# Patient Record
Sex: Male | Born: 1955 | Race: White | Hispanic: No | Marital: Married | Smoking: Never smoker
Health system: Southern US, Community
[De-identification: ages and names within clinical notes are randomized; demographics above are authoritative.]

## PROBLEM LIST (undated history)

## (undated) DIAGNOSIS — K5792 Diverticulitis of intestine, part unspecified, without perforation or abscess without bleeding: Secondary | ICD-10-CM

## (undated) DIAGNOSIS — Z8701 Personal history of pneumonia (recurrent): Secondary | ICD-10-CM

## (undated) DIAGNOSIS — E119 Type 2 diabetes mellitus without complications: Secondary | ICD-10-CM

## (undated) DIAGNOSIS — M1990 Unspecified osteoarthritis, unspecified site: Secondary | ICD-10-CM

## (undated) DIAGNOSIS — Z87442 Personal history of urinary calculi: Secondary | ICD-10-CM

## (undated) DIAGNOSIS — K5790 Diverticulosis of intestine, part unspecified, without perforation or abscess without bleeding: Secondary | ICD-10-CM

## (undated) DIAGNOSIS — J45909 Unspecified asthma, uncomplicated: Secondary | ICD-10-CM

## (undated) DIAGNOSIS — E785 Hyperlipidemia, unspecified: Secondary | ICD-10-CM

## (undated) DIAGNOSIS — J189 Pneumonia, unspecified organism: Secondary | ICD-10-CM

## (undated) DIAGNOSIS — D72819 Decreased white blood cell count, unspecified: Secondary | ICD-10-CM

## (undated) DIAGNOSIS — J40 Bronchitis, not specified as acute or chronic: Secondary | ICD-10-CM

## (undated) DIAGNOSIS — U071 COVID-19: Secondary | ICD-10-CM

## (undated) DIAGNOSIS — K760 Fatty (change of) liver, not elsewhere classified: Secondary | ICD-10-CM

## (undated) DIAGNOSIS — F411 Generalized anxiety disorder: Secondary | ICD-10-CM

## (undated) DIAGNOSIS — I10 Essential (primary) hypertension: Secondary | ICD-10-CM

## (undated) DIAGNOSIS — G2581 Restless legs syndrome: Secondary | ICD-10-CM

## (undated) DIAGNOSIS — G4733 Obstructive sleep apnea (adult) (pediatric): Secondary | ICD-10-CM

## (undated) DIAGNOSIS — G4730 Sleep apnea, unspecified: Secondary | ICD-10-CM

## (undated) DIAGNOSIS — D696 Thrombocytopenia, unspecified: Secondary | ICD-10-CM

## (undated) DIAGNOSIS — M170 Bilateral primary osteoarthritis of knee: Secondary | ICD-10-CM

## (undated) DIAGNOSIS — Z87828 Personal history of other (healed) physical injury and trauma: Secondary | ICD-10-CM

## (undated) DIAGNOSIS — T7840XA Allergy, unspecified, initial encounter: Secondary | ICD-10-CM

## (undated) DIAGNOSIS — J449 Chronic obstructive pulmonary disease, unspecified: Secondary | ICD-10-CM

## (undated) HISTORY — DX: Personal history of other (healed) physical injury and trauma: Z87.828

## (undated) HISTORY — PX: EXCISIONAL HEMORRHOIDECTOMY: SHX1541

## (undated) HISTORY — PX: TONSILLECTOMY: SUR1361

## (undated) HISTORY — DX: Unspecified osteoarthritis, unspecified site: M19.90

## (undated) HISTORY — DX: Decreased white blood cell count, unspecified: D72.819

## (undated) HISTORY — DX: Generalized anxiety disorder: F41.1

## (undated) HISTORY — PX: MENISCUS REPAIR: SHX5179

## (undated) HISTORY — DX: Hyperlipidemia, unspecified: E78.5

## (undated) HISTORY — DX: Personal history of pneumonia (recurrent): Z87.01

## (undated) HISTORY — DX: Essential (primary) hypertension: I10

## (undated) HISTORY — DX: Sleep apnea, unspecified: G47.30

## (undated) HISTORY — DX: Unspecified asthma, uncomplicated: J45.909

## (undated) HISTORY — PX: ELBOW ARTHROSCOPY WITH TENDON RECONSTRUCTION: SHX5616

## (undated) HISTORY — DX: Allergy, unspecified, initial encounter: T78.40XA

## (undated) HISTORY — DX: Thrombocytopenia, unspecified: D69.6

## (undated) HISTORY — DX: Restless legs syndrome: G25.81

## (undated) HISTORY — PX: HERNIA REPAIR: SHX51

---

## 1987-12-26 HISTORY — PX: VASECTOMY: SHX75

## 2004-12-25 HISTORY — PX: UMBILICAL HERNIA REPAIR: SHX196

## 2005-02-13 ENCOUNTER — Ambulatory Visit: Payer: Self-pay | Admitting: Surgery

## 2006-04-12 ENCOUNTER — Ambulatory Visit: Payer: Self-pay | Admitting: Gastroenterology

## 2008-10-26 ENCOUNTER — Ambulatory Visit: Payer: Self-pay

## 2008-10-30 ENCOUNTER — Ambulatory Visit: Payer: Self-pay

## 2008-11-11 ENCOUNTER — Ambulatory Visit: Payer: Self-pay

## 2009-03-11 ENCOUNTER — Ambulatory Visit: Payer: Self-pay

## 2009-07-14 ENCOUNTER — Ambulatory Visit: Payer: Self-pay | Admitting: Orthopedic Surgery

## 2009-07-16 ENCOUNTER — Ambulatory Visit: Payer: Self-pay | Admitting: Orthopedic Surgery

## 2009-07-20 ENCOUNTER — Ambulatory Visit: Payer: Self-pay | Admitting: Orthopedic Surgery

## 2010-05-05 ENCOUNTER — Ambulatory Visit: Payer: Self-pay | Admitting: Orthopedic Surgery

## 2010-06-03 ENCOUNTER — Ambulatory Visit: Payer: Self-pay | Admitting: Orthopedic Surgery

## 2010-07-13 ENCOUNTER — Ambulatory Visit: Payer: Self-pay | Admitting: Orthopedic Surgery

## 2010-07-21 ENCOUNTER — Ambulatory Visit: Payer: Self-pay | Admitting: Orthopedic Surgery

## 2010-09-30 ENCOUNTER — Ambulatory Visit: Payer: Self-pay | Admitting: Orthopedic Surgery

## 2010-09-30 IMAGING — US US EXTREM LOW VENOUS*L*
1 series · 17 of 21 positions shown · non-contrast
Comparison: none

REASON FOR EXAM: left knee pain and swelling
COMMENTS:

[Series 1: us extrem low venous*left* · 17 of 21 slices shown]
[im 1/21]
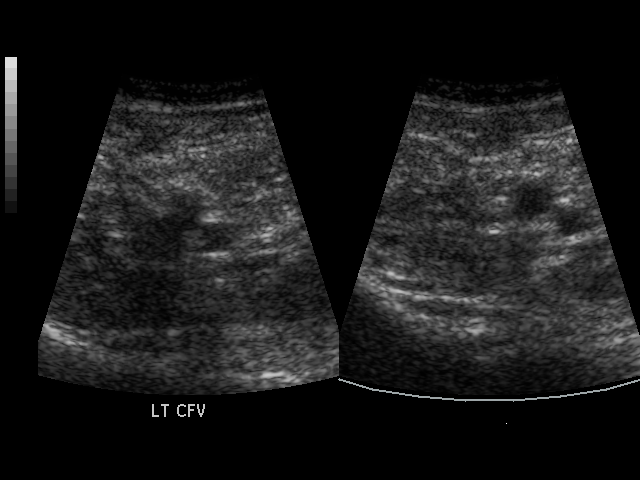
[im 2/21]
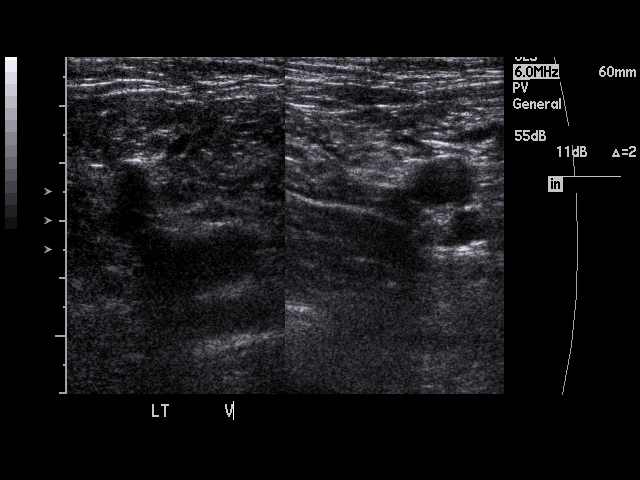
[im 4/21]
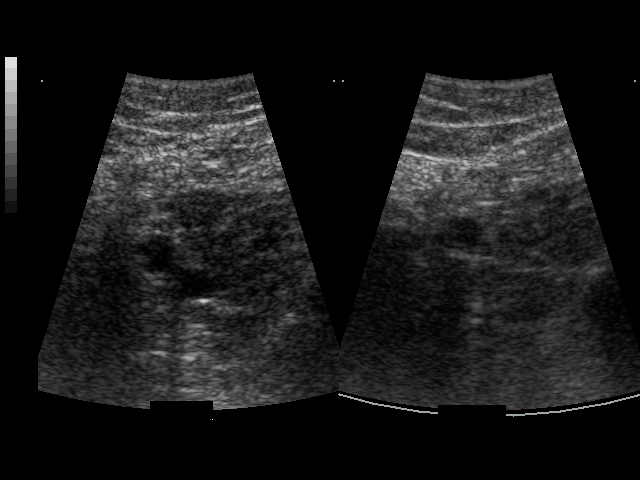
[im 5/21]
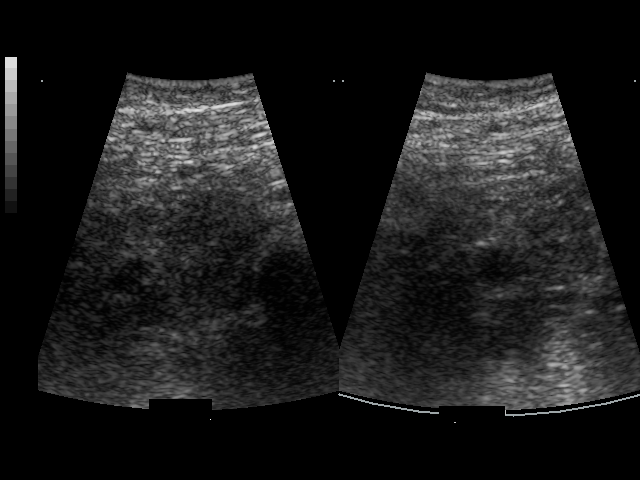
[im 6/21]
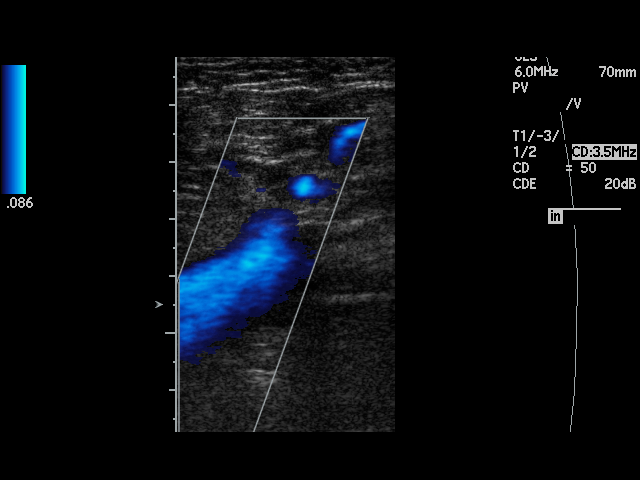
[im 7/21]
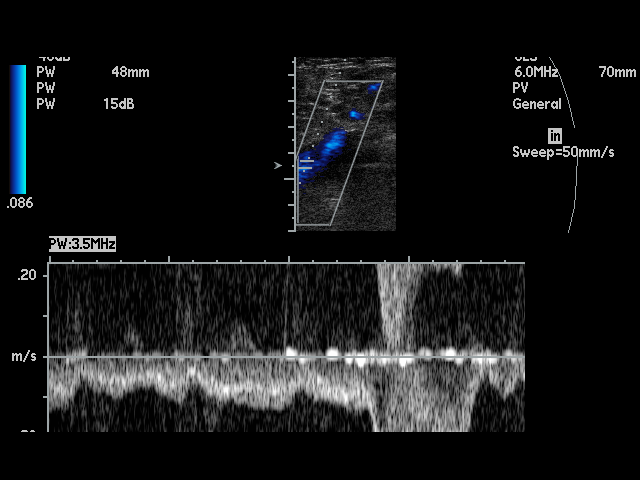
[im 9/21]
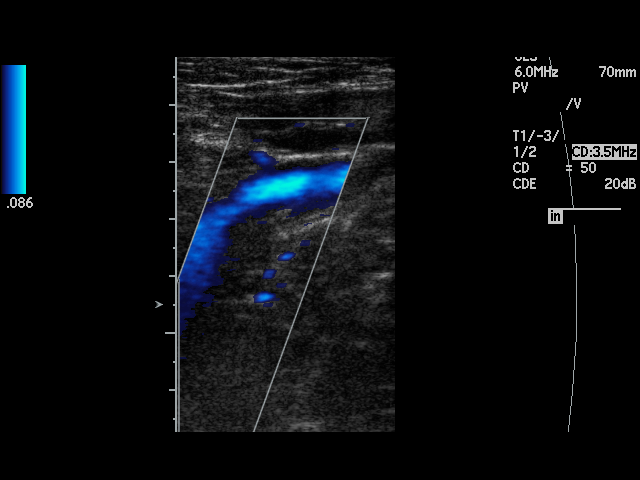
[im 10/21]
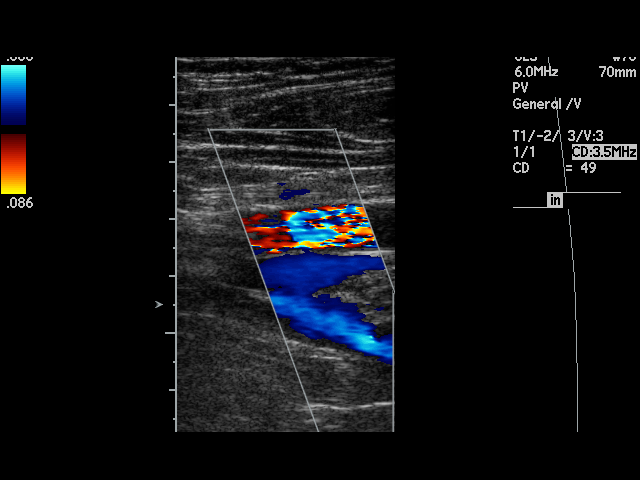
[im 11/21]
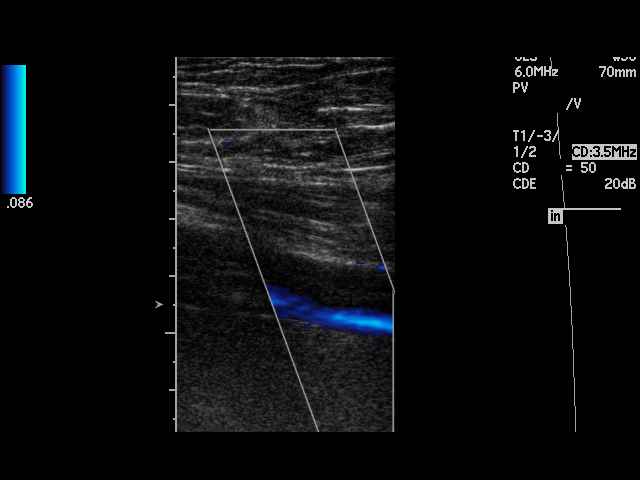
[im 12/21]
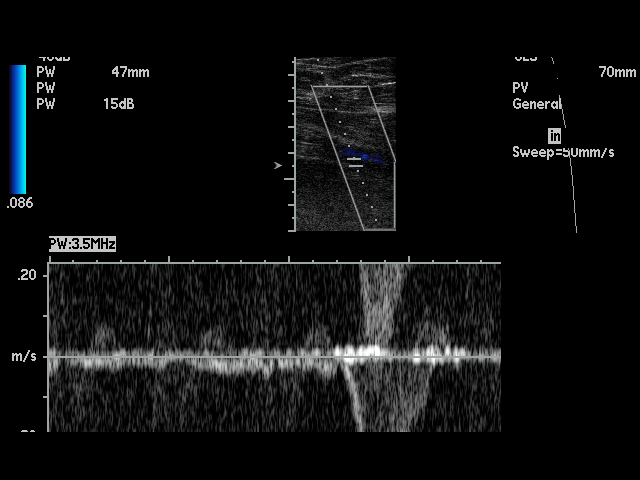
[im 13/21]
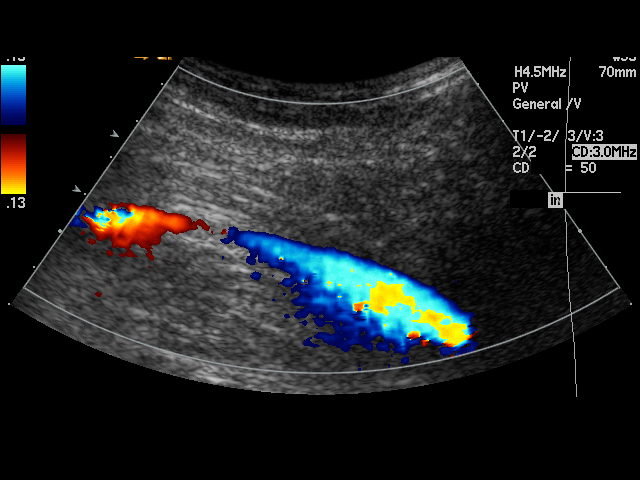
[im 15/21]
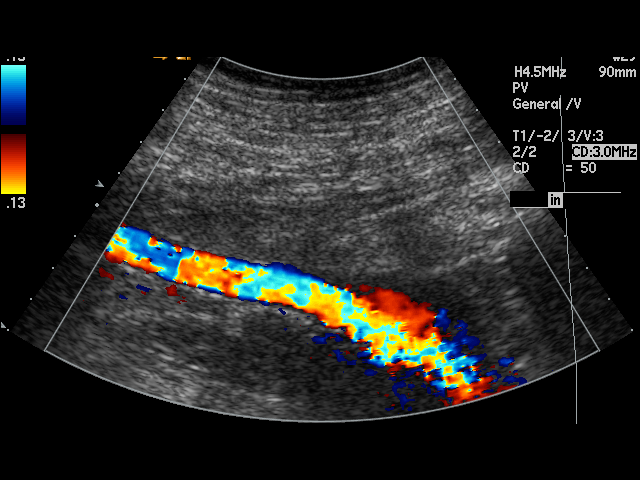
[im 16/21]
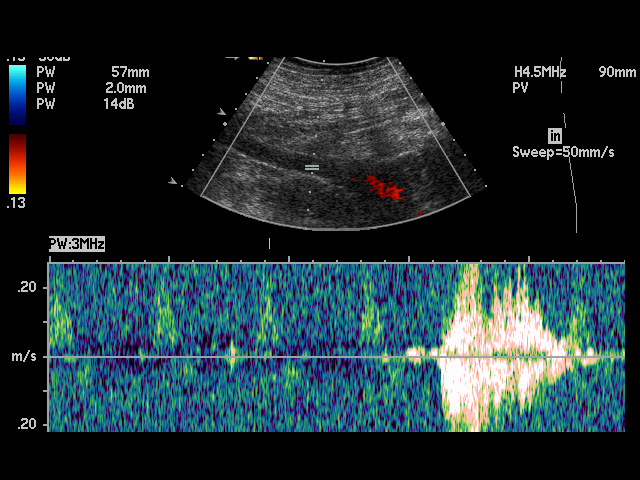
[im 17/21]
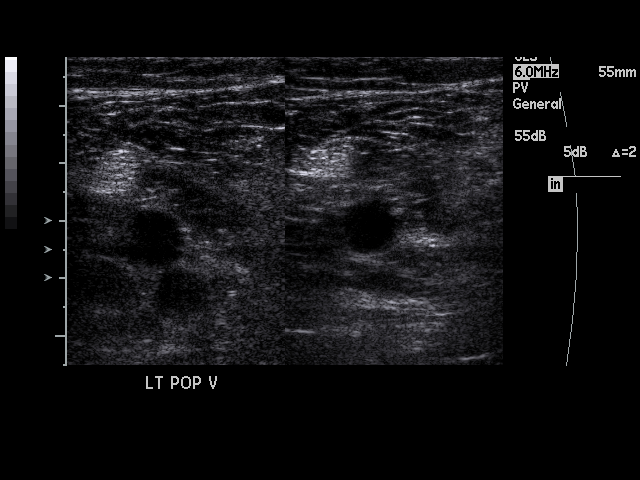
[im 18/21]
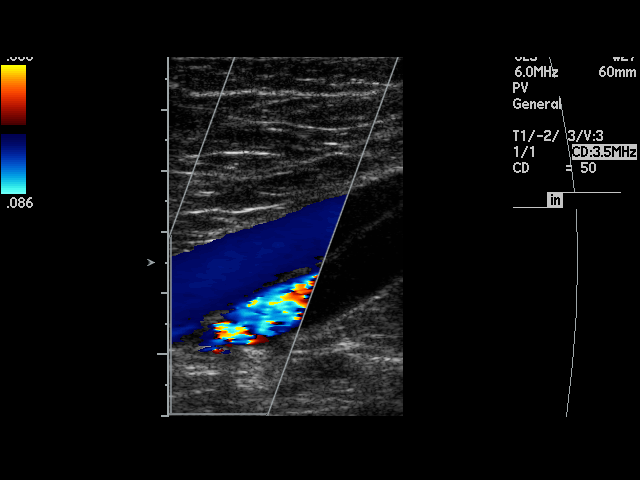
[im 20/21]
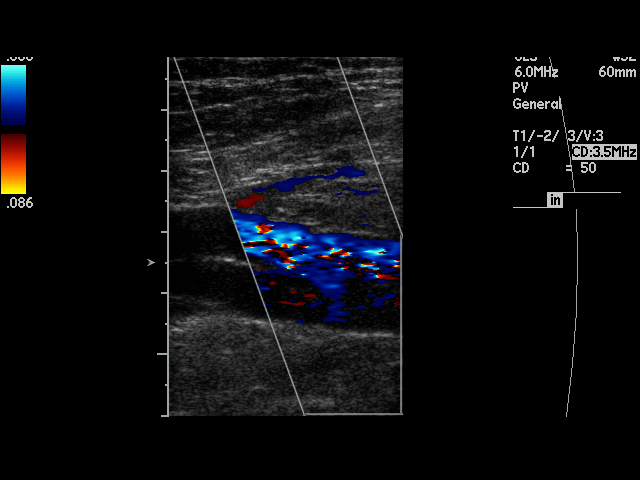
[im 21/21]
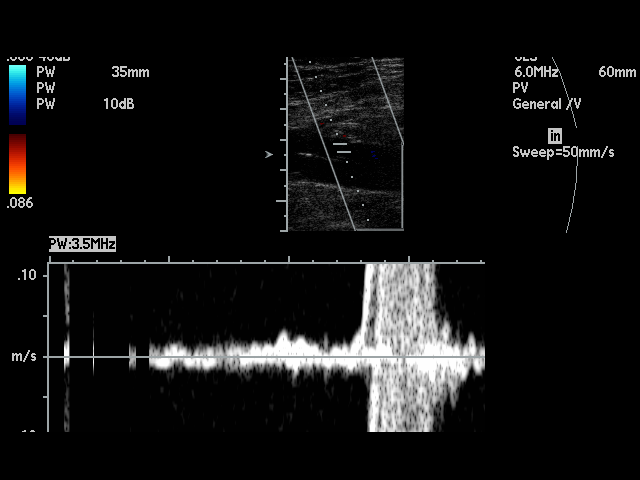

[17 of 21 positions shown; findings below may reference images not displayed]

PROCEDURE:     US  - US DOPPLER LOW EXTR LEFT  - [DATE]  [DATE]

RESULT:     The phasic, augmentation and Valsalva flow waveforms are normal
in appearance. The left femoral and popliteal vein shows complete
compressibility throughout its course. Doppler examination shows no
occlusion or evidence for deep vein thrombosis.
IMPRESSION: No deep vein thrombosis is identified in the left leg.

## 2011-06-29 ENCOUNTER — Ambulatory Visit: Payer: Self-pay | Admitting: Gastroenterology

## 2011-06-29 HISTORY — PX: COLONOSCOPY: SHX174

## 2013-05-12 ENCOUNTER — Ambulatory Visit: Payer: Self-pay | Admitting: Orthopedic Surgery

## 2013-05-23 ENCOUNTER — Ambulatory Visit: Payer: Self-pay | Admitting: Orthopedic Surgery

## 2014-10-14 DIAGNOSIS — G4733 Obstructive sleep apnea (adult) (pediatric): Secondary | ICD-10-CM | POA: Insufficient documentation

## 2015-03-24 ENCOUNTER — Ambulatory Visit: Payer: Self-pay | Admitting: Specialist

## 2015-03-30 ENCOUNTER — Ambulatory Visit
Admit: 2015-03-30 | Disposition: A | Discharge status: Home / Self Care | Payer: Self-pay | Attending: Internal Medicine | Admitting: Internal Medicine

## 2015-03-30 LAB — CBC CANCER CENTER
BANDS NEUTROPHIL: 3 %
Basophil: 3 %
COMMENT - H1-COM2: NORMAL
Comment - H1-Com1: NORMAL
Eosinophil: 2 %
HCT: 44.2 % (ref 40.0–52.0)
HGB: 14.8 g/dL (ref 13.0–18.0)
Lymphocytes: 27 %
MCH: 29.7 pg (ref 26.0–34.0)
MCHC: 33.4 g/dL (ref 32.0–36.0)
MCV: 89 fL (ref 80–100)
Monocytes: 10 %
PLATELETS: 137 x10 3/mm — AB (ref 150–440)
RBC: 4.98 10*6/uL (ref 4.40–5.90)
RDW: 13.5 % (ref 11.5–14.5)
Segmented Neutrophils: 55 %
WBC: 2.8 x10 3/mm — AB (ref 3.8–10.6)

## 2015-03-30 LAB — RETICULOCYTES
Absolute Retic Count: 0.0702 10*6/uL (ref 0.019–0.186)
RETICULOCYTE: 1.41 % (ref 0.4–3.1)

## 2015-03-30 LAB — PROTIME-INR
INR: 1
PROTHROMBIN TIME: 13.8 s

## 2015-03-30 LAB — IRON AND TIBC
IRON BIND. CAP.(TOTAL): 429 (ref 250–450)
IRON SATURATION: 25.2
Iron: 108 ug/dL
UNBOUND IRON-BIND. CAP.: 320.5

## 2015-03-30 LAB — APTT: ACTIVATED PTT: 28.4 s (ref 23.6–35.9)

## 2015-03-30 LAB — FERRITIN: FERRITIN (ARMC): 122 ng/mL

## 2015-03-30 LAB — LACTATE DEHYDROGENASE: LDH: 203 U/L — ABNORMAL HIGH

## 2015-03-30 LAB — FOLATE: Folic Acid: 26 ng/mL

## 2015-03-31 LAB — PROT IMMUNOELECTROPHORES(ARMC)

## 2015-07-05 ENCOUNTER — Encounter: Payer: 59 | Attending: Internal Medicine | Admitting: *Deleted

## 2015-07-05 ENCOUNTER — Encounter: Payer: Self-pay | Admitting: *Deleted

## 2015-07-05 ENCOUNTER — Other Ambulatory Visit: Payer: Self-pay

## 2015-07-05 VITALS — BP 128/82 | Ht 69.0 in | Wt 262.9 lb

## 2015-07-05 DIAGNOSIS — E119 Type 2 diabetes mellitus without complications: Secondary | ICD-10-CM | POA: Diagnosis not present

## 2015-07-05 DIAGNOSIS — D696 Thrombocytopenia, unspecified: Secondary | ICD-10-CM

## 2015-07-05 NOTE — Patient Instructions (Addendum)
Check blood sugars 1 x day before breakfast or 2 hrs after supper every day Exercise: Begin walking for 15  minutes  3 days a week and gradually increase to 30 minutes 5 x week Eat 3 meals day,   2  snacks a day Space meals 4-6 hours apart Limit fried foods and those high in fat

## 2015-07-05 NOTE — Progress Notes (Signed)
Diabetes Self-Management Education  Visit Type: First/Initial  Appt. Start Time: 0910 Appt. End Time: 1020  07/05/2015  [name], identified by name and date of birth, is a 59 y.o. male with a diagnosis of Diabetes: Type 2.     ASSESSMENT Blood pressure 128/82, height 5\' 9"  (1.753 m), weight 262 lb 14.4 oz (119.251 kg). Body mass index is 38.81 kg/(m^2).  Initial Visit Information: Are you currently following a meal plan?: Yes What type of meal plan do you follow?: eating whole grains, more vegetables, less sugars Are you taking your medications as prescribed?: Yes Are you checking your feet?: Yes How many days per week are you checking your feet?: 7 How often do you need to have someone help you when you read instructions, pamphlets, or other written materials from your doctor or pharmacy?: 1 - Never What is the last grade level you completed in school?: AAS  Psychosocial: Patient Belief/Attitude about Diabetes: Motivated to manage diabetes ("not happy but feel like I can learn how to deal with it now") Self-care barriers: None Self-management support: Family Patient Concerns: Nutrition/Meal planning, Medication, Problem Solving, Weight Control, Glycemic Control, Other  ("have more energy, less fatigue") Special Needs: None Preferred Learning Style: Visual, Auditory, Hands on Learning Readiness: Change in progress  Complications:  Last HgB A1C per patient/outside source: 8.3 % (06/16/15); 7 % (03/24/15) How often do you check your blood sugar?: 1-2 times/day Fasting Blood glucose range (mg/dL): 16-10970-129, [phone] (FBG's 140-180 mg/dL with reading of 981108 mg/dL today) Postprandial Blood glucose range (mg/dL): 19-14770-129, [phone] (pp 130-865125-156 mg/dL) Have you had a dilated eye exam in the past 12 months?: Yes Have you had a dental exam in the past 12 months?: Yes  Diet Intake: Breakfast: eggs, egg McMuffin Snack (morning): protein bar, nuts Lunch: eats out; sandwiches,  hamburger Dinner: meat and vegetables, occasional potatoe Snack (evening): cereal and milk Beverage(s): water, unsweetened beverages  Exercise: Exercise: ADL's  Individualized Plan for Diabetes Self-Management Training:  Learning Objective:  Patient will have a greater understanding of diabetes self-management.   Education Topics Reviewed with Patient Today: Definition of diabetes, type 1 and 2, and the diagnosis of diabetes, Factors that contribute to the development of diabetes Role of diet in the treatment of diabetes and the relationship between the three main macronutrients and blood glucose level Role of exercise on diabetes management, blood pressure control and cardiac health. Reviewed patients medication for diabetes, action, purpose, timing of dose and side effects. Purpose and frequency of SMBG., Identified appropriate SMBG and/or A1C goals. Relationship between chronic complications and blood glucose control, Lipid levels, blood glucose control and heart disease Identified and addressed patients feelings and concerns about diabetes  PATIENTS GOALS/Plan (Developed by the patient): Improve blood sugars Decrease medications Prevent diabetes complications Lose weight Have more energy, less fatigue  Plan:   Patient Instructions  Check blood sugars 1 x day before breakfast or 2 hrs after supper every day Exercise: Begin walking for 15  minutes  3 days a week and gradually increase to 30 minutes 5 x week Eat 3 meals day,   2  snacks a day Space meals 4-6 hours apart Limit fried foods and those high in fat  Expected Outcomes:  Demonstrated interest in learning. Expect positive outcomes  Education material provided:  General Meal Planning Guidelines  If problems or questions, patient to contact team via:  Sharion SettlerSheila Dametria Tuzzolino, RN, CCM, CDE [phone]  Future DSME appointment:   August 02, 2015 for Class  1             

## 2015-07-06 ENCOUNTER — Inpatient Hospital Stay: Payer: 59 | Attending: Internal Medicine

## 2015-07-06 DIAGNOSIS — D72819 Decreased white blood cell count, unspecified: Secondary | ICD-10-CM | POA: Diagnosis present

## 2015-07-06 DIAGNOSIS — D696 Thrombocytopenia, unspecified: Secondary | ICD-10-CM | POA: Insufficient documentation

## 2015-07-06 LAB — CBC
HEMATOCRIT: 43.5 % (ref 40.0–52.0)
HEMOGLOBIN: 14.4 g/dL (ref 13.0–18.0)
MCH: 29.4 pg (ref 26.0–34.0)
MCHC: 33.1 g/dL (ref 32.0–36.0)
MCV: 88.7 fL (ref 80.0–100.0)
Platelets: 149 10*3/uL — ABNORMAL LOW (ref 150–440)
RBC: 4.9 MIL/uL (ref 4.40–5.90)
RDW: 13.8 % (ref 11.5–14.5)
WBC: 3.3 10*3/uL — ABNORMAL LOW (ref 3.8–10.6)

## 2015-07-06 LAB — LACTATE DEHYDROGENASE: LDH: 192 U/L (ref 98–192)

## 2015-08-02 ENCOUNTER — Encounter: Payer: 59 | Attending: Internal Medicine | Admitting: Dietician

## 2015-08-02 VITALS — Ht 69.0 in | Wt 265.1 lb

## 2015-08-02 DIAGNOSIS — E119 Type 2 diabetes mellitus without complications: Secondary | ICD-10-CM | POA: Diagnosis not present

## 2015-08-02 NOTE — Progress Notes (Signed)

## 2015-08-09 ENCOUNTER — Encounter: Payer: Self-pay | Admitting: Dietician

## 2015-08-09 ENCOUNTER — Encounter: Payer: 59 | Admitting: Dietician

## 2015-08-09 VITALS — Wt 261.5 lb

## 2015-08-09 DIAGNOSIS — E119 Type 2 diabetes mellitus without complications: Secondary | ICD-10-CM

## 2015-08-09 NOTE — Progress Notes (Signed)

## 2015-08-16 ENCOUNTER — Encounter: Payer: 59 | Admitting: Dietician

## 2015-08-16 VITALS — BP 146/92 | Ht 69.0 in | Wt 260.7 lb

## 2015-08-16 DIAGNOSIS — E119 Type 2 diabetes mellitus without complications: Secondary | ICD-10-CM

## 2015-08-16 NOTE — Progress Notes (Signed)

## 2015-08-27 ENCOUNTER — Encounter: Payer: Self-pay | Admitting: *Deleted

## 2015-10-06 ENCOUNTER — Telehealth: Payer: Self-pay | Admitting: *Deleted

## 2015-10-06 ENCOUNTER — Other Ambulatory Visit: Payer: Self-pay | Admitting: *Deleted

## 2015-10-06 ENCOUNTER — Inpatient Hospital Stay: Payer: 59 | Attending: Internal Medicine

## 2015-10-06 DIAGNOSIS — D696 Thrombocytopenia, unspecified: Secondary | ICD-10-CM | POA: Diagnosis not present

## 2015-10-06 DIAGNOSIS — D72819 Decreased white blood cell count, unspecified: Secondary | ICD-10-CM

## 2015-10-06 LAB — CBC WITH DIFFERENTIAL/PLATELET
Basophils Absolute: 0 10*3/uL (ref 0–0.1)
Basophils Relative: 1 %
Eosinophils Absolute: 0.2 10*3/uL (ref 0–0.7)
Eosinophils Relative: 6 %
HCT: 41.8 % (ref 40.0–52.0)
Hemoglobin: 14.2 g/dL (ref 13.0–18.0)
Lymphocytes Relative: 33 %
Lymphs Abs: 1 10*3/uL (ref 1.0–3.6)
MCH: 30 pg (ref 26.0–34.0)
MCHC: 34 g/dL (ref 32.0–36.0)
MCV: 88.2 fL (ref 80.0–100.0)
Monocytes Absolute: 0.5 10*3/uL (ref 0.2–1.0)
Monocytes Relative: 15 %
Neutro Abs: 1.4 10*3/uL (ref 1.4–6.5)
Neutrophils Relative %: 45 %
Platelets: 148 10*3/uL — ABNORMAL LOW (ref 150–440)
RBC: 4.74 MIL/uL (ref 4.40–5.90)
RDW: 12.9 % (ref 11.5–14.5)
WBC: 3.2 10*3/uL — ABNORMAL LOW (ref 3.8–10.6)

## 2015-10-06 LAB — LACTATE DEHYDROGENASE: LDH: 159 U/L (ref 98–192)

## 2015-10-06 NOTE — Telephone Encounter (Signed)
Called to let pt know that his labs were pretty much unchanged since last one done 3 mos. Ago.   I did mention that his LDH is lower but still wnl.  I had spoke to Dr. B and he would prefer that the next time pt comes in is Jan. And he would prefer that he get labs and see md at same time. Pt agreeable to this and he uses my chart and he will get the appt change. I have sent inbasket message to change the 01/06/16 appt to lab and md

## 2015-10-27 ENCOUNTER — Encounter: Payer: Self-pay | Admitting: Internal Medicine

## 2015-10-28 ENCOUNTER — Encounter: Payer: Self-pay | Admitting: Internal Medicine

## 2016-01-04 ENCOUNTER — Encounter: Payer: Self-pay | Admitting: *Deleted

## 2016-01-04 ENCOUNTER — Other Ambulatory Visit: Payer: Self-pay | Admitting: *Deleted

## 2016-01-04 DIAGNOSIS — D696 Thrombocytopenia, unspecified: Secondary | ICD-10-CM

## 2016-01-06 ENCOUNTER — Encounter: Payer: Self-pay | Admitting: Internal Medicine

## 2016-01-06 ENCOUNTER — Inpatient Hospital Stay (HOSPITAL_BASED_OUTPATIENT_CLINIC_OR_DEPARTMENT_OTHER): Payer: 59

## 2016-01-06 ENCOUNTER — Inpatient Hospital Stay: Payer: 59 | Attending: Internal Medicine | Admitting: Internal Medicine

## 2016-01-06 ENCOUNTER — Other Ambulatory Visit: Payer: Self-pay

## 2016-01-06 VITALS — BP 145/92 | HR 66 | Temp 97.9°F | Resp 20 | Ht 69.0 in | Wt 257.3 lb

## 2016-01-06 DIAGNOSIS — M1990 Unspecified osteoarthritis, unspecified site: Secondary | ICD-10-CM | POA: Insufficient documentation

## 2016-01-06 DIAGNOSIS — D72819 Decreased white blood cell count, unspecified: Secondary | ICD-10-CM

## 2016-01-06 DIAGNOSIS — G2581 Restless legs syndrome: Secondary | ICD-10-CM | POA: Insufficient documentation

## 2016-01-06 DIAGNOSIS — J45909 Unspecified asthma, uncomplicated: Secondary | ICD-10-CM | POA: Insufficient documentation

## 2016-01-06 DIAGNOSIS — D696 Thrombocytopenia, unspecified: Secondary | ICD-10-CM

## 2016-01-06 DIAGNOSIS — D649 Anemia, unspecified: Secondary | ICD-10-CM | POA: Diagnosis not present

## 2016-01-06 DIAGNOSIS — I10 Essential (primary) hypertension: Secondary | ICD-10-CM | POA: Diagnosis not present

## 2016-01-06 DIAGNOSIS — G4730 Sleep apnea, unspecified: Secondary | ICD-10-CM | POA: Insufficient documentation

## 2016-01-06 DIAGNOSIS — Z79899 Other long term (current) drug therapy: Secondary | ICD-10-CM | POA: Diagnosis not present

## 2016-01-06 LAB — CBC WITH DIFFERENTIAL/PLATELET
BASOS ABS: 0 10*3/uL (ref 0–0.1)
Basophils Relative: 1 %
EOS ABS: 0.1 10*3/uL (ref 0–0.7)
EOS PCT: 3 %
HCT: 34.3 % — ABNORMAL LOW (ref 40.0–52.0)
Hemoglobin: 11.4 g/dL — ABNORMAL LOW (ref 13.0–18.0)
LYMPHS PCT: 20 %
Lymphs Abs: 0.5 10*3/uL — ABNORMAL LOW (ref 1.0–3.6)
MCH: 28.9 pg (ref 26.0–34.0)
MCHC: 33.2 g/dL (ref 32.0–36.0)
MCV: 87.2 fL (ref 80.0–100.0)
MONO ABS: 0.4 10*3/uL (ref 0.2–1.0)
Monocytes Relative: 16 %
Neutro Abs: 1.6 10*3/uL (ref 1.4–6.5)
Neutrophils Relative %: 60 %
PLATELETS: 172 10*3/uL (ref 150–440)
RBC: 3.94 MIL/uL — AB (ref 4.40–5.90)
RDW: 13.9 % (ref 11.5–14.5)
WBC: 2.6 10*3/uL — ABNORMAL LOW (ref 3.8–10.6)

## 2016-01-06 LAB — LACTATE DEHYDROGENASE: LDH: 148 U/L (ref 98–192)

## 2016-01-06 NOTE — Progress Notes (Signed)
[facility] OFFICE PROGRESS NOTE  Patient Care Team: Marcine Matarharles W Phillips Jr., MD as PCP - General (Family Medicine)   SUMMARY OF HEMATOLOGIC-ONCOLOGIC HISTORY:  # April 2016- MILD non-spec PERSISTENT LEUKOPENIA [2.6-3.2]; MILD Thrombocytopenia [130s-140s]  # Fatty liver [March 2016]; Asthma/OSA   INTERVAL HISTORY:  This is my first interaction with the patient since I joined the practice September 2016. I reviewed the patient's prior charts/pertinent labs/imaging in detail; findings are summarized above.    60 year old male patient with above history of  Mild leukopenia asymptomatic;  And also mild thrombocytopenia is here for follow-up.  Patient denies any frequent infections. Denies any  Easy bruising or bleeding.  He continues to have chronic fatigue.   No nausea no vomiting  Or abdominal pain.   REVIEW OF SYSTEMS:  A complete 10 point review of system is done which is negative except mentioned above/history of present illness.   PAST MEDICAL HISTORY :  Past Medical History  Diagnosis Date  . Hypertension   . Asthma   . Arthritis   . Sleep apnea     on CPAP  . Restless leg syndrome   . GAD (generalized anxiety disorder)   . Allergy     Seasonal  . Decreased white blood cell count   . Leukopenia   . Thrombocytopenia (HCC)   . Hyperlipidemia   . History of pneumonia   . History of meniscal tear     PAST SURGICAL HISTORY :   Past Surgical History  Procedure Laterality Date  . Hernia repair    . Elbow arthroscopy with tendon reconstruction Right   . Meniscus repair Left     Knee surgery for meniscus tear left side  . Tonsillectomy    . Excisional hemorrhoidectomy    . Colonoscopy    . Vasectomy  1989    FAMILY HISTORY :   Family History  Problem Relation Age of Onset  . Hypertension    . Asthma    . Anemia    . Arthritis    . Kidney cancer    . Lung cancer    . Prostate cancer Father   . Skin cancer Father   . Stomach cancer       SOCIAL HISTORY:   Social History  Substance Use Topics  . Smoking status: Never Smoker   . Smokeless tobacco: Never Used  . Alcohol Use: 1.8 oz/week    3 Cans of beer per week     Comment: Took alcohol on a regular basis at younger age, up until 4140s, now occasional intake only    ALLERGIES:  is allergic to fish allergy and tomato.  MEDICATIONS:  Current Outpatient Prescriptions  Medication Sig Dispense Refill  . albuterol (PROVENTIL HFA;VENTOLIN HFA) 108 (90 BASE) MCG/ACT inhaler Inhale 2 puffs into the lungs 4 (four) times daily as needed.    . Azelastine-Fluticasone (DYMISTA) 137-50 MCG/ACT SUSP Place 1 spray into the nose daily.    . Budesonide 90 MCG/ACT inhaler Inhale 1 puff into the lungs 2 (two) times daily.    Marland Kitchen. escitalopram (LEXAPRO) 20 MG tablet Take 20 mg by mouth daily.    Marland Kitchen. losartan (COZAAR) 50 MG tablet Take 50 mg by mouth daily.    . meloxicam (MOBIC) 15 MG tablet Take 15 mg by mouth daily as needed.    . metFORMIN (GLUCOPHAGE-XR) 500 MG 24 hr tablet Take 1,000 mg by mouth daily after supper.    . pramipexole (MIRAPEX) 0.25 MG tablet Take  0.25 mg by mouth daily.    . modafinil (PROVIGIL) 100 MG tablet Take 100 mg by mouth daily. Reported on 01/06/2016     No current facility-administered medications for this visit.    PHYSICAL EXAMINATION:  BP 145/92 mmHg  Pulse 66  Temp(Src) 97.9 F (36.6 C) (Tympanic)  Resp 20  Ht 5\' 9"  (1.753 m)  Wt 257 lb 4.4 oz (116.7 kg)  BMI 37.98 kg/m2  SpO2 98%  Filed Weights   01/06/16 1014  Weight: 257 lb 4.4 oz (116.7 kg)    GENERAL: Well-nourished well-developed; Alert, no distress and comfortable.   Obese. EYES: no pallor or icterus OROPHARYNX: no thrush or ulceration; good dentition  NECK: supple, no masses felt LYMPH:  no palpable lymphadenopathy in the cervical, axillary or inguinal regions LUNGS: clear to auscultation and  No wheeze or crackles HEART/CVS: regular rate & rhythm and no murmurs; No lower extremity  edema ABDOMEN:abdomen soft, non-tender and normal bowel sounds Musculoskeletal:no cyanosis of digits and no clubbing  PSYCH: alert & oriented x 3 with fluent speech NEURO: no focal motor/sensory deficits SKIN:  no rashes or significant lesions  LABORATORY DATA:  I have reviewed the data as listed No results found for: NA, K, CL, CO2, GLUCOSE, BUN, CREATININE, CALCIUM, PROT, ALBUMIN, AST, ALT, ALKPHOS, BILITOT, GFRNONAA, GFRAA  No results found for: SPEP, UPEP  Lab Results  Component Value Date   WBC 2.6* 01/06/2016   NEUTROABS 1.6 01/06/2016   HGB 11.4* 01/06/2016   HCT 34.3* 01/06/2016   MCV 87.2 01/06/2016   PLT 172 01/06/2016      Chemistry   No results found for: NA, K, CL, CO2, BUN, CREATININE, GLU No results found for: CALCIUM, ALKPHOS, AST, ALT, BILITOT     RADIOGRAPHIC STUDIES: I have personally reviewed the radiological images as listed and agreed with the findings in the report. No results found.   ASSESSMENT & PLAN:   # Mild leukopenia-asymptomatic/mild lymphopenia ALC-no clear etiology noted. Moderate from now  # Intermittent thrombocytopenia- 130s to 140s; today is 176  # Mild anemia-new onset hemoglobin 11.4; previous hemoglobin has been 14. I do not think this can explain this fatigue.  Patient had prior colonoscopy.  Denies any active bleeding at this time. Monitor for now  # 15 minutes face-to-face with the patient discussing the above plan of care; more than 50% of time spent on prognosis/ natural history; counseling and coordination.      Earna Coder, MD 01/06/2016 10:57 AM

## 2016-04-05 ENCOUNTER — Other Ambulatory Visit: Payer: Self-pay

## 2016-04-05 ENCOUNTER — Ambulatory Visit: Payer: Self-pay | Admitting: Internal Medicine

## 2016-04-24 ENCOUNTER — Encounter: Payer: Self-pay | Admitting: Internal Medicine

## 2016-04-25 ENCOUNTER — Other Ambulatory Visit: Payer: Self-pay | Admitting: Internal Medicine

## 2016-04-25 ENCOUNTER — Other Ambulatory Visit: Payer: Self-pay | Admitting: *Deleted

## 2016-04-25 DIAGNOSIS — D649 Anemia, unspecified: Secondary | ICD-10-CM

## 2016-04-25 DIAGNOSIS — D696 Thrombocytopenia, unspecified: Secondary | ICD-10-CM

## 2016-04-25 DIAGNOSIS — R5382 Chronic fatigue, unspecified: Secondary | ICD-10-CM

## 2016-04-27 ENCOUNTER — Inpatient Hospital Stay: Payer: 59 | Attending: Internal Medicine

## 2016-04-27 DIAGNOSIS — R5382 Chronic fatigue, unspecified: Secondary | ICD-10-CM

## 2016-04-27 DIAGNOSIS — Z79899 Other long term (current) drug therapy: Secondary | ICD-10-CM | POA: Diagnosis not present

## 2016-04-27 DIAGNOSIS — D649 Anemia, unspecified: Secondary | ICD-10-CM | POA: Diagnosis not present

## 2016-04-27 DIAGNOSIS — D72819 Decreased white blood cell count, unspecified: Secondary | ICD-10-CM | POA: Diagnosis not present

## 2016-04-27 DIAGNOSIS — D696 Thrombocytopenia, unspecified: Secondary | ICD-10-CM | POA: Insufficient documentation

## 2016-04-27 LAB — CBC WITH DIFFERENTIAL/PLATELET
Basophils Absolute: 0 10*3/uL (ref 0–0.1)
Basophils Relative: 1 %
EOS PCT: 5 %
Eosinophils Absolute: 0.1 10*3/uL (ref 0–0.7)
HCT: 42.2 % (ref 40.0–52.0)
Hemoglobin: 14.3 g/dL (ref 13.0–18.0)
LYMPHS ABS: 0.7 10*3/uL — AB (ref 1.0–3.6)
LYMPHS PCT: 26 %
MCH: 26.7 pg (ref 26.0–34.0)
MCHC: 33.9 g/dL (ref 32.0–36.0)
MCV: 78.7 fL — AB (ref 80.0–100.0)
Monocytes Absolute: 0.5 10*3/uL (ref 0.2–1.0)
Monocytes Relative: 18 %
Neutro Abs: 1.4 10*3/uL (ref 1.4–6.5)
Neutrophils Relative %: 50 %
PLATELETS: 155 10*3/uL (ref 150–440)
RBC: 5.36 MIL/uL (ref 4.40–5.90)
RDW: 16.8 % — ABNORMAL HIGH (ref 11.5–14.5)
WBC: 2.8 10*3/uL — AB (ref 3.8–10.6)

## 2016-04-27 LAB — COMPREHENSIVE METABOLIC PANEL
ALBUMIN: 4.3 g/dL (ref 3.5–5.0)
ALT: 45 U/L (ref 17–63)
AST: 41 U/L (ref 15–41)
Alkaline Phosphatase: 57 U/L (ref 38–126)
Anion gap: 7 (ref 5–15)
BILIRUBIN TOTAL: 0.8 mg/dL (ref 0.3–1.2)
BUN: 19 mg/dL (ref 6–20)
CHLORIDE: 103 mmol/L (ref 101–111)
CO2: 25 mmol/L (ref 22–32)
Calcium: 9.7 mg/dL (ref 8.9–10.3)
Creatinine, Ser: 0.79 mg/dL (ref 0.61–1.24)
GFR calc Af Amer: >60 mL/min (ref >60)
GFR calc non Af Amer: >60 mL/min (ref >60)
GLUCOSE: 195 mg/dL — AB (ref 65–99)
POTASSIUM: 4.2 mmol/L (ref 3.5–5.1)
Sodium: 135 mmol/L (ref 135–145)
Total Protein: 7.7 g/dL (ref 6.5–8.1)

## 2016-04-27 LAB — IRON AND TIBC
Iron: 110 ug/dL (ref 45–182)
SATURATION RATIOS: 23 % (ref 17.9–39.5)
TIBC: 470 ug/dL — ABNORMAL HIGH (ref 250–450)
UIBC: 360 ug/dL

## 2016-04-27 LAB — FERRITIN: Ferritin: 24 ng/mL (ref 24–336)

## 2016-05-01 ENCOUNTER — Other Ambulatory Visit: Payer: Self-pay | Admitting: *Deleted

## 2016-05-01 ENCOUNTER — Encounter: Payer: Self-pay | Admitting: *Deleted

## 2016-05-01 DIAGNOSIS — D696 Thrombocytopenia, unspecified: Secondary | ICD-10-CM

## 2016-05-01 DIAGNOSIS — D509 Iron deficiency anemia, unspecified: Secondary | ICD-10-CM

## 2016-05-03 ENCOUNTER — Other Ambulatory Visit: Payer: Self-pay

## 2016-05-10 DIAGNOSIS — D72819 Decreased white blood cell count, unspecified: Secondary | ICD-10-CM | POA: Diagnosis not present

## 2016-05-18 ENCOUNTER — Other Ambulatory Visit: Payer: Self-pay

## 2016-05-18 DIAGNOSIS — D72819 Decreased white blood cell count, unspecified: Secondary | ICD-10-CM | POA: Diagnosis not present

## 2016-05-18 DIAGNOSIS — D696 Thrombocytopenia, unspecified: Secondary | ICD-10-CM

## 2016-05-18 LAB — OCCULT BLOOD X 1 CARD TO LAB, STOOL
FECAL OCCULT BLD: NEGATIVE
Fecal Occult Bld: NEGATIVE

## 2016-07-05 ENCOUNTER — Inpatient Hospital Stay: Payer: 59 | Attending: Oncology

## 2016-07-05 ENCOUNTER — Inpatient Hospital Stay (HOSPITAL_BASED_OUTPATIENT_CLINIC_OR_DEPARTMENT_OTHER): Payer: 59 | Admitting: Oncology

## 2016-07-05 VITALS — BP 162/96 | HR 66 | Temp 99.0°F | Wt 257.2 lb

## 2016-07-05 DIAGNOSIS — G4730 Sleep apnea, unspecified: Secondary | ICD-10-CM | POA: Insufficient documentation

## 2016-07-05 DIAGNOSIS — D72819 Decreased white blood cell count, unspecified: Secondary | ICD-10-CM | POA: Insufficient documentation

## 2016-07-05 DIAGNOSIS — I10 Essential (primary) hypertension: Secondary | ICD-10-CM | POA: Insufficient documentation

## 2016-07-05 DIAGNOSIS — Z79899 Other long term (current) drug therapy: Secondary | ICD-10-CM

## 2016-07-05 DIAGNOSIS — G2581 Restless legs syndrome: Secondary | ICD-10-CM | POA: Diagnosis not present

## 2016-07-05 DIAGNOSIS — D696 Thrombocytopenia, unspecified: Secondary | ICD-10-CM | POA: Insufficient documentation

## 2016-07-05 DIAGNOSIS — M1990 Unspecified osteoarthritis, unspecified site: Secondary | ICD-10-CM | POA: Insufficient documentation

## 2016-07-05 DIAGNOSIS — J45909 Unspecified asthma, uncomplicated: Secondary | ICD-10-CM | POA: Insufficient documentation

## 2016-07-05 DIAGNOSIS — D509 Iron deficiency anemia, unspecified: Secondary | ICD-10-CM

## 2016-07-05 DIAGNOSIS — F419 Anxiety disorder, unspecified: Secondary | ICD-10-CM | POA: Insufficient documentation

## 2016-07-05 DIAGNOSIS — G47419 Narcolepsy without cataplexy: Secondary | ICD-10-CM | POA: Insufficient documentation

## 2016-07-05 LAB — COMPREHENSIVE METABOLIC PANEL
ALBUMIN: 4.3 g/dL (ref 3.5–5.0)
ALT: 56 U/L (ref 17–63)
ANION GAP: 6 (ref 5–15)
AST: 48 U/L — ABNORMAL HIGH (ref 15–41)
Alkaline Phosphatase: 57 U/L (ref 38–126)
BUN: 21 mg/dL — ABNORMAL HIGH (ref 6–20)
CHLORIDE: 106 mmol/L (ref 101–111)
CO2: 22 mmol/L (ref 22–32)
Calcium: 9.4 mg/dL (ref 8.9–10.3)
Creatinine, Ser: 0.67 mg/dL (ref 0.61–1.24)
GFR calc non Af Amer: >60 mL/min (ref >60)
GLUCOSE: 175 mg/dL — AB (ref 65–99)
POTASSIUM: 4.1 mmol/L (ref 3.5–5.1)
SODIUM: 134 mmol/L — AB (ref 135–145)
Total Bilirubin: 0.9 mg/dL (ref 0.3–1.2)
Total Protein: 7.6 g/dL (ref 6.5–8.1)

## 2016-07-05 LAB — CBC WITH DIFFERENTIAL/PLATELET
BASOS PCT: 1 %
Basophils Absolute: 0 10*3/uL (ref 0–0.1)
EOS ABS: 0.1 10*3/uL (ref 0–0.7)
EOS PCT: 4 %
HCT: 41.8 % (ref 40.0–52.0)
HEMOGLOBIN: 14.4 g/dL (ref 13.0–18.0)
LYMPHS ABS: 0.7 10*3/uL — AB (ref 1.0–3.6)
Lymphocytes Relative: 23 %
MCH: 29 pg (ref 26.0–34.0)
MCHC: 34.4 g/dL (ref 32.0–36.0)
MCV: 84.1 fL (ref 80.0–100.0)
Monocytes Absolute: 0.5 10*3/uL (ref 0.2–1.0)
Monocytes Relative: 17 %
NEUTROS PCT: 55 %
Neutro Abs: 1.7 10*3/uL (ref 1.4–6.5)
PLATELETS: 147 10*3/uL — AB (ref 150–440)
RBC: 4.96 MIL/uL (ref 4.40–5.90)
RDW: 17 % — ABNORMAL HIGH (ref 11.5–14.5)
WBC: 3.1 10*3/uL — AB (ref 3.8–10.6)

## 2016-07-05 LAB — IRON AND TIBC
IRON: 94 ug/dL (ref 45–182)
SATURATION RATIOS: 24 % (ref 17.9–39.5)
TIBC: 390 ug/dL (ref 250–450)
UIBC: 296 ug/dL

## 2016-07-05 LAB — FERRITIN: FERRITIN: 52 ng/mL (ref 24–336)

## 2016-07-05 LAB — LACTATE DEHYDROGENASE: LDH: 162 U/L (ref 98–192)

## 2016-07-05 NOTE — Progress Notes (Signed)
[facility] OFFICE PROGRESS NOTE  Patient Care Team: Marcine Matar., MD as PCP - General (Family Medicine)   SUMMARY OF HEMATOLOGIC-ONCOLOGIC HISTORY:  # April 2016- MILD non-spec PERSISTENT LEUKOPENIA [2.6-3.2]; MILD Thrombocytopenia [130s-140s]  # Fatty liver [March 2016]; Asthma/OSA   INTERVAL HISTORY:  Patient is a 60 year old male patient with above history of  Mild leukopenia asymptomatic;  And also mild thrombocytopenia is here for follow-up.  He currently feels well other than chronic fatigue. He does take oral iron since May 2017. Patient denies any frequent infections. Denies any  Easy bruising or bleeding.    No nausea no vomiting  Or abdominal pain.   REVIEW OF SYSTEMS:  A complete 10 point review of system is done which is negative except mentioned above/history of present illness.   PAST MEDICAL HISTORY :  Past Medical History  Diagnosis Date  . Hypertension   . Asthma   . Arthritis   . Sleep apnea     on CPAP  . Restless leg syndrome   . GAD (generalized anxiety disorder)   . Allergy     Seasonal  . Decreased white blood cell count   . Leukopenia   . Thrombocytopenia (HCC)   . Hyperlipidemia   . History of pneumonia   . History of meniscal tear     PAST SURGICAL HISTORY :   Past Surgical History  Procedure Laterality Date  . Hernia repair    . Elbow arthroscopy with tendon reconstruction Right   . Meniscus repair Left     Knee surgery for meniscus tear left side  . Tonsillectomy    . Excisional hemorrhoidectomy    . Colonoscopy  06/29/2011  . Vasectomy  1989    FAMILY HISTORY :   Family History  Problem Relation Age of Onset  . Hypertension    . Asthma    . Anemia    . Arthritis    . Kidney cancer    . Lung cancer    . Prostate cancer Father   . Skin cancer Father   . Stomach cancer      SOCIAL HISTORY:   Social History  Substance Use Topics  . Smoking status: Never Smoker   . Smokeless tobacco: Never Used  .  Alcohol Use: 1.8 oz/week    3 Cans of beer per week     Comment: Took alcohol on a regular basis at younger age, up until 12s, now occasional intake only    ALLERGIES:  is allergic to fish allergy and tomato.  MEDICATIONS:  Current Outpatient Prescriptions  Medication Sig Dispense Refill  . albuterol (PROVENTIL HFA;VENTOLIN HFA) 108 (90 BASE) MCG/ACT inhaler Inhale 2 puffs into the lungs 4 (four) times daily as needed.    . Azelastine-Fluticasone (DYMISTA) 137-50 MCG/ACT SUSP Place 1 spray into the nose daily.    . Budesonide (PULMICORT FLEXHALER) 90 MCG/ACT inhaler Inhale into the lungs.    Marland Kitchen escitalopram (LEXAPRO) 20 MG tablet     . glucose blood test strip     . losartan (COZAAR) 100 MG tablet Take by mouth.    . meloxicam (MOBIC) 15 MG tablet Take 15 mg by mouth daily as needed.    . metFORMIN (GLUCOPHAGE-XR) 500 MG 24 hr tablet     . pramipexole (MIRAPEX) 0.25 MG tablet Take by mouth.    . metFORMIN (GLUCOPHAGE-XR) 500 MG 24 hr tablet Take 1,000 mg by mouth daily after supper.     No current facility-administered  medications for this visit.    PHYSICAL EXAMINATION:  BP 162/96 mmHg  Pulse 66  Temp(Src) 99 F (37.2 C) (Tympanic)  Wt 257 lb 4 oz (116.688 kg)  Filed Weights   07/05/16 1033  Weight: 257 lb 4 oz (116.688 kg)    GENERAL: Well-nourished well-developed; Alert, no distress and comfortable.   Obese. EYES: no pallor or icterus OROPHARYNX: no thrush or ulceration; good dentition  NECK: supple, no masses felt LYMPH:  no palpable lymphadenopathy in the cervical, axillary or inguinal regions LUNGS: clear to auscultation and  No wheeze or crackles HEART/CVS: regular rate & rhythm and no murmurs; No lower extremity edema ABDOMEN:abdomen soft, non-tender and normal bowel sounds Musculoskeletal:no cyanosis of digits and no clubbing  PSYCH: alert & oriented x 3 with fluent speech NEURO: no focal motor/sensory deficits SKIN:  no rashes or significant  lesions  LABORATORY DATA:  I have reviewed the data as listed    Component Value Date/Time   NA 134* 07/05/2016 1005   K 4.1 07/05/2016 1005   CL 106 07/05/2016 1005   CO2 22 07/05/2016 1005   GLUCOSE 175* 07/05/2016 1005   BUN 21* 07/05/2016 1005   CREATININE 0.67 07/05/2016 1005   CALCIUM 9.4 07/05/2016 1005   PROT 7.6 07/05/2016 1005   ALBUMIN 4.3 07/05/2016 1005   AST 48* 07/05/2016 1005   ALT 56 07/05/2016 1005   ALKPHOS 57 07/05/2016 1005   BILITOT 0.9 07/05/2016 1005   GFRNONAA >60 07/05/2016 1005   GFRAA >60 07/05/2016 1005    No results found for: SPEP, UPEP  Lab Results  Component Value Date   WBC 3.1* 07/05/2016   NEUTROABS 1.7 07/05/2016   HGB 14.4 07/05/2016   HCT 41.8 07/05/2016   MCV 84.1 07/05/2016   PLT 147* 07/05/2016      Chemistry      Component Value Date/Time   NA 134* 07/05/2016 1005   K 4.1 07/05/2016 1005   CL 106 07/05/2016 1005   CO2 22 07/05/2016 1005   BUN 21* 07/05/2016 1005   CREATININE 0.67 07/05/2016 1005      Component Value Date/Time   CALCIUM 9.4 07/05/2016 1005   ALKPHOS 57 07/05/2016 1005   AST 48* 07/05/2016 1005   ALT 56 07/05/2016 1005   BILITOT 0.9 07/05/2016 1005       RADIOGRAPHIC STUDIES: I have personally reviewed the radiological images as listed and agreed with the findings in the report. No results found.   ASSESSMENT & PLAN:   1.  Mild leukopenia-asymptomatic/mild lymphopenia ALC-no clear etiology noted. Monitor  2.  Intermittent thrombocytopenia- 130s to 140s; today is 147. Monitor  3. Return to clinic in 3 months with labs.   Dr. Donneta RombergBrahmanday was available for consultation and review of plan of care for this patient.     Genevie Cheshirericia Kenyetta Wimbish, NP 07/05/2016 10:48 AM

## 2016-07-11 ENCOUNTER — Other Ambulatory Visit: Payer: Self-pay | Admitting: Internal Medicine

## 2016-07-11 ENCOUNTER — Encounter: Payer: Self-pay | Admitting: Oncology

## 2016-07-11 ENCOUNTER — Telehealth: Payer: Self-pay | Admitting: Internal Medicine

## 2016-07-11 NOTE — Telephone Encounter (Signed)
I reviewed the labs from July 12th- iron studies not suggestive of iron deficiency. White count slightly low/chronic. Continue current dose of iron/ no changes to the dose of iron.   # No clear etiology of patient's fatigue noted on current blood work. As fatigue continues to be an issue- he should follow up with his PCP.   Trish- Please inform pt of above.   Dr.B

## 2016-07-11 NOTE — Telephone Encounter (Signed)
MD's msg communicated back via mychart msg.

## 2017-01-04 DIAGNOSIS — J310 Chronic rhinitis: Secondary | ICD-10-CM | POA: Diagnosis not present

## 2017-01-04 DIAGNOSIS — J4521 Mild intermittent asthma with (acute) exacerbation: Secondary | ICD-10-CM | POA: Diagnosis not present

## 2017-01-04 DIAGNOSIS — G4733 Obstructive sleep apnea (adult) (pediatric): Secondary | ICD-10-CM | POA: Diagnosis not present

## 2017-01-05 ENCOUNTER — Ambulatory Visit: Payer: 59 | Admitting: Internal Medicine

## 2017-01-05 ENCOUNTER — Other Ambulatory Visit: Payer: 59

## 2017-01-16 DIAGNOSIS — J449 Chronic obstructive pulmonary disease, unspecified: Secondary | ICD-10-CM | POA: Diagnosis not present

## 2017-01-18 DIAGNOSIS — G4730 Sleep apnea, unspecified: Secondary | ICD-10-CM | POA: Diagnosis not present

## 2017-01-22 ENCOUNTER — Inpatient Hospital Stay (HOSPITAL_BASED_OUTPATIENT_CLINIC_OR_DEPARTMENT_OTHER): Payer: 59 | Admitting: Internal Medicine

## 2017-01-22 ENCOUNTER — Inpatient Hospital Stay: Payer: 59 | Attending: Internal Medicine

## 2017-01-22 ENCOUNTER — Other Ambulatory Visit: Payer: Self-pay | Admitting: *Deleted

## 2017-01-22 DIAGNOSIS — G2581 Restless legs syndrome: Secondary | ICD-10-CM | POA: Diagnosis not present

## 2017-01-22 DIAGNOSIS — D72810 Lymphocytopenia: Secondary | ICD-10-CM | POA: Insufficient documentation

## 2017-01-22 DIAGNOSIS — E785 Hyperlipidemia, unspecified: Secondary | ICD-10-CM | POA: Insufficient documentation

## 2017-01-22 DIAGNOSIS — I10 Essential (primary) hypertension: Secondary | ICD-10-CM | POA: Diagnosis not present

## 2017-01-22 DIAGNOSIS — D649 Anemia, unspecified: Secondary | ICD-10-CM | POA: Insufficient documentation

## 2017-01-22 DIAGNOSIS — J45909 Unspecified asthma, uncomplicated: Secondary | ICD-10-CM | POA: Diagnosis not present

## 2017-01-22 DIAGNOSIS — Z79899 Other long term (current) drug therapy: Secondary | ICD-10-CM | POA: Diagnosis not present

## 2017-01-22 DIAGNOSIS — G4730 Sleep apnea, unspecified: Secondary | ICD-10-CM | POA: Diagnosis not present

## 2017-01-22 DIAGNOSIS — R748 Abnormal levels of other serum enzymes: Secondary | ICD-10-CM | POA: Diagnosis not present

## 2017-01-22 DIAGNOSIS — D696 Thrombocytopenia, unspecified: Secondary | ICD-10-CM | POA: Diagnosis not present

## 2017-01-22 LAB — CBC WITH DIFFERENTIAL/PLATELET
Basophils Absolute: 0 10*3/uL (ref 0–0.1)
Basophils Relative: 1 %
Eosinophils Absolute: 0.1 10*3/uL (ref 0–0.7)
Eosinophils Relative: 4 %
HCT: 43.7 % (ref 40.0–52.0)
Hemoglobin: 14.8 g/dL (ref 13.0–18.0)
Lymphocytes Relative: 22 %
Lymphs Abs: 0.8 10*3/uL — ABNORMAL LOW (ref 1.0–3.6)
MCH: 29.3 pg (ref 26.0–34.0)
MCHC: 33.9 g/dL (ref 32.0–36.0)
MCV: 86.5 fL (ref 80.0–100.0)
MONOS PCT: 15 %
Monocytes Absolute: 0.5 10*3/uL (ref 0.2–1.0)
NEUTROS ABS: 2 10*3/uL (ref 1.4–6.5)
Neutrophils Relative %: 58 %
PLATELETS: 151 10*3/uL (ref 150–440)
RBC: 5.05 MIL/uL (ref 4.40–5.90)
RDW: 13.3 % (ref 11.5–14.5)
WBC: 3.5 10*3/uL — ABNORMAL LOW (ref 3.8–10.6)

## 2017-01-22 LAB — LACTATE DEHYDROGENASE: LDH: 188 U/L (ref 98–192)

## 2017-01-22 LAB — COMPREHENSIVE METABOLIC PANEL
ALT: 92 U/L — ABNORMAL HIGH (ref 17–63)
ANION GAP: 4 — AB (ref 5–15)
AST: 67 U/L — ABNORMAL HIGH (ref 15–41)
Albumin: 4.4 g/dL (ref 3.5–5.0)
Alkaline Phosphatase: 58 U/L (ref 38–126)
BUN: 19 mg/dL (ref 6–20)
CHLORIDE: 102 mmol/L (ref 101–111)
CO2: 26 mmol/L (ref 22–32)
Calcium: 9.4 mg/dL (ref 8.9–10.3)
Creatinine, Ser: 0.83 mg/dL (ref 0.61–1.24)
Glucose, Bld: 176 mg/dL — ABNORMAL HIGH (ref 65–99)
POTASSIUM: 4.2 mmol/L (ref 3.5–5.1)
SODIUM: 132 mmol/L — AB (ref 135–145)
Total Bilirubin: 1 mg/dL (ref 0.3–1.2)
Total Protein: 7.8 g/dL (ref 6.5–8.1)

## 2017-01-22 NOTE — Progress Notes (Signed)
[facility] OFFICE PROGRESS NOTE  Patient Care Team: Marcine Matar., MD as PCP - General (Family Medicine)   SUMMARY OF HEMATOLOGIC-ONCOLOGIC HISTORY:  # April 2016- MILD non-spec PERSISTENT LEUKOPENIA [2.6-3.2]; MILD Thrombocytopenia [130s-140s]  # Fatty liver [March 2016]; Asthma/OSA   INTERVAL HISTORY:   61 year old male patient with above history of  Mild leukopenia asymptomatic;  And also mild thrombocytopenia is here for follow-up.  Patient denies any frequent infections. Denies any  easy bruising or bleeding.  He states that his fatigue is better and he is feeling good.   No nausea no vomiting  Or abdominal pain.   REVIEW OF SYSTEMS:  A complete 10 point review of system is done which is negative except mentioned above/history of present illness.   PAST MEDICAL HISTORY :  Past Medical History:  Diagnosis Date  . Allergy    Seasonal  . Arthritis   . Asthma   . Decreased white blood cell count   . GAD (generalized anxiety disorder)   . History of meniscal tear   . History of pneumonia   . Hyperlipidemia   . Hypertension   . Leukopenia   . Restless leg syndrome   . Sleep apnea    on CPAP  . Thrombocytopenia (HCC)     PAST SURGICAL HISTORY :   Past Surgical History:  Procedure Laterality Date  . COLONOSCOPY  06/29/2011  . ELBOW ARTHROSCOPY WITH TENDON RECONSTRUCTION Right   . EXCISIONAL HEMORRHOIDECTOMY    . HERNIA REPAIR    . MENISCUS REPAIR Left    Knee surgery for meniscus tear left side  . TONSILLECTOMY    . VASECTOMY  1989    FAMILY HISTORY :   Family History  Problem Relation Age of Onset  . Hypertension    . Asthma    . Anemia    . Arthritis    . Kidney cancer    . Lung cancer    . Prostate cancer Father   . Skin cancer Father   . Stomach cancer      SOCIAL HISTORY:   Social History  Substance Use Topics  . Smoking status: Never Smoker  . Smokeless tobacco: Never Used  . Alcohol use 1.8 oz/week    3 Cans of beer  per week     Comment: Took alcohol on a regular basis at younger age, up until 32s, now occasional intake only    ALLERGIES:  is allergic to fish allergy and tomato.  MEDICATIONS:  Current Outpatient Prescriptions  Medication Sig Dispense Refill  . albuterol (PROVENTIL HFA;VENTOLIN HFA) 108 (90 BASE) MCG/ACT inhaler Inhale 2 puffs into the lungs 4 (four) times daily as needed.    . Azelastine-Fluticasone (DYMISTA) 137-50 MCG/ACT SUSP Place 1 spray into the nose daily.    . Budesonide (PULMICORT FLEXHALER) 90 MCG/ACT inhaler Inhale into the lungs.    Marland Kitchen escitalopram (LEXAPRO) 20 MG tablet Take 20 mg by mouth daily.     Marland Kitchen glucose blood test strip     . IRON PO Take by mouth.    . losartan (COZAAR) 100 MG tablet Take 100 mg by mouth daily.     . meloxicam (MOBIC) 15 MG tablet Take 15 mg by mouth daily as needed.    . metFORMIN (GLUCOPHAGE-XR) 500 MG 24 hr tablet Take 1,000 mg by mouth daily with breakfast.     . montelukast (SINGULAIR) 10 MG tablet Take 10 mg by mouth at bedtime.    . pramipexole (  MIRAPEX) 0.25 MG tablet Take 0.25 mg by mouth daily.      No current facility-administered medications for this visit.     PHYSICAL EXAMINATION:  BP (!) 157/99 (BP Location: Right Arm, Patient Position: Sitting)   Pulse 62   Temp 97 F (36.1 C) (Tympanic)   Wt 255 lb 4 oz (115.8 kg)   BMI 37.69 kg/m   Filed Weights   01/22/17 1428  Weight: 255 lb 4 oz (115.8 kg)    GENERAL: Well-nourished well-developed; Alert, no distress and comfortable.   Obese. EYES: no pallor or icterus OROPHARYNX: no thrush or ulceration; good dentition  NECK: supple, no masses felt LYMPH:  no palpable lymphadenopathy in the cervical, axillary or inguinal regions LUNGS: clear to auscultation and  No wheeze or crackles HEART/CVS: regular rate & rhythm and no murmurs; No lower extremity edema ABDOMEN:abdomen soft, non-tender and normal bowel sounds Musculoskeletal:no cyanosis of digits and no clubbing  PSYCH:  alert & oriented x 3 with fluent speech NEURO: no focal motor/sensory deficits SKIN:  no rashes or significant lesions  LABORATORY DATA:  I have reviewed the data as listed    Component Value Date/Time   NA 132 (L) 01/22/2017 1410   K 4.2 01/22/2017 1410   CL 102 01/22/2017 1410   CO2 26 01/22/2017 1410   GLUCOSE 176 (H) 01/22/2017 1410   BUN 19 01/22/2017 1410   CREATININE 0.83 01/22/2017 1410   CALCIUM 9.4 01/22/2017 1410   PROT 7.8 01/22/2017 1410   ALBUMIN 4.4 01/22/2017 1410   AST 67 (H) 01/22/2017 1410   ALT 92 (H) 01/22/2017 1410   ALKPHOS 58 01/22/2017 1410   BILITOT 1.0 01/22/2017 1410   GFRNONAA >60 01/22/2017 1410   GFRAA >60 01/22/2017 1410    No results found for: SPEP, UPEP  Lab Results  Component Value Date   WBC 3.5 (L) 01/22/2017   NEUTROABS 2.0 01/22/2017   HGB 14.8 01/22/2017   HCT 43.7 01/22/2017   MCV 86.5 01/22/2017   PLT 151 01/22/2017      Chemistry      Component Value Date/Time   NA 132 (L) 01/22/2017 1410   K 4.2 01/22/2017 1410   CL 102 01/22/2017 1410   CO2 26 01/22/2017 1410   BUN 19 01/22/2017 1410   CREATININE 0.83 01/22/2017 1410      Component Value Date/Time   CALCIUM 9.4 01/22/2017 1410   ALKPHOS 58 01/22/2017 1410   AST 67 (H) 01/22/2017 1410   ALT 92 (H) 01/22/2017 1410   BILITOT 1.0 01/22/2017 1410       RADIOGRAPHIC STUDIES: I have personally reviewed the radiological images as listed and agreed with the findings in the report. No results found.   ASSESSMENT & PLAN:   Thrombocytopenia # Mild leukopenia-asymptomatic/mild lymphopenia ALC-Stable. Monitor for now.   # Intermittent thrombocytopenia- Stable: today is 151.   # Mild anemia- Much better. Hemoglobin 14.5 today.; up from 11.4 during last visit. Patient will be getting colonoscopy next month. Denies any active bleeding at this time. Monitor for now.  #Elevated liver enzymes: AST 67, ALT 92.  Encouraged healthy diet, regular exercise and weight loss.  Monitor for now.   # Follow-up in one year with Labs (CBC, CMET) and MD.       Durenda HurtJennifer Burns, NP  Mauro KaufmannJennifer E Burns, NP 01/22/2017 4:02 PM

## 2017-01-22 NOTE — Assessment & Plan Note (Addendum)
#   Mild leukopenia-asymptomatic/mild lymphopenia ALC-Stable. Monitor for now.   # Intermittent thrombocytopenia- Stable: today is 15.1.   # Mild anemia- Much better. Hemoglobin 14.5 today.; up from 11.4 during last visit. Patient will be getting colonoscopy next month. Denies any active bleeding at this time. Monitor for now.  #Elevated liver enzymes: AST 67, ALT 92.  Encouraged healthy diet, regular exercise and weight loss. Monitor for now.   # Follow-up in one year with Labs (CBC, CMET) and MD.

## 2017-01-22 NOTE — Progress Notes (Signed)
Patient here today for follow up.  Patient states no new concerns today  

## 2017-02-02 DIAGNOSIS — Z86010 Personal history of colonic polyps: Secondary | ICD-10-CM | POA: Diagnosis not present

## 2017-02-02 DIAGNOSIS — R7989 Other specified abnormal findings of blood chemistry: Secondary | ICD-10-CM | POA: Diagnosis not present

## 2017-02-09 DIAGNOSIS — R7989 Other specified abnormal findings of blood chemistry: Secondary | ICD-10-CM | POA: Diagnosis not present

## 2017-04-18 DIAGNOSIS — J019 Acute sinusitis, unspecified: Secondary | ICD-10-CM | POA: Diagnosis not present

## 2017-04-18 DIAGNOSIS — B9689 Other specified bacterial agents as the cause of diseases classified elsewhere: Secondary | ICD-10-CM | POA: Diagnosis not present

## 2017-05-14 DIAGNOSIS — G4733 Obstructive sleep apnea (adult) (pediatric): Secondary | ICD-10-CM | POA: Diagnosis not present

## 2017-05-18 ENCOUNTER — Encounter: Payer: Self-pay | Admitting: *Deleted

## 2017-05-22 ENCOUNTER — Encounter
Admission: RE | Disposition: A | Discharge status: Home / Self Care | Payer: Self-pay | Source: Ambulatory Visit | Attending: Gastroenterology

## 2017-05-22 ENCOUNTER — Encounter: Payer: Self-pay | Admitting: *Deleted

## 2017-05-22 ENCOUNTER — Ambulatory Visit
Admission: RE | Admit: 2017-05-22 | Discharge: 2017-05-22 | Disposition: A | Discharge status: Home / Self Care | Payer: 59 | Source: Ambulatory Visit | Attending: Gastroenterology | Admitting: Gastroenterology

## 2017-05-22 ENCOUNTER — Ambulatory Visit: Payer: 59 | Admitting: Anesthesiology

## 2017-05-22 DIAGNOSIS — Z91018 Allergy to other foods: Secondary | ICD-10-CM | POA: Diagnosis not present

## 2017-05-22 DIAGNOSIS — Z7951 Long term (current) use of inhaled steroids: Secondary | ICD-10-CM | POA: Diagnosis not present

## 2017-05-22 DIAGNOSIS — Z7984 Long term (current) use of oral hypoglycemic drugs: Secondary | ICD-10-CM | POA: Insufficient documentation

## 2017-05-22 DIAGNOSIS — Z86010 Personal history of colonic polyps: Secondary | ICD-10-CM | POA: Diagnosis not present

## 2017-05-22 DIAGNOSIS — Z1211 Encounter for screening for malignant neoplasm of colon: Secondary | ICD-10-CM | POA: Insufficient documentation

## 2017-05-22 DIAGNOSIS — J45909 Unspecified asthma, uncomplicated: Secondary | ICD-10-CM | POA: Diagnosis not present

## 2017-05-22 DIAGNOSIS — F411 Generalized anxiety disorder: Secondary | ICD-10-CM | POA: Diagnosis not present

## 2017-05-22 DIAGNOSIS — E119 Type 2 diabetes mellitus without complications: Secondary | ICD-10-CM | POA: Insufficient documentation

## 2017-05-22 DIAGNOSIS — Z6838 Body mass index (BMI) 38.0-38.9, adult: Secondary | ICD-10-CM | POA: Insufficient documentation

## 2017-05-22 DIAGNOSIS — K5730 Diverticulosis of large intestine without perforation or abscess without bleeding: Secondary | ICD-10-CM | POA: Diagnosis not present

## 2017-05-22 DIAGNOSIS — G4730 Sleep apnea, unspecified: Secondary | ICD-10-CM | POA: Diagnosis not present

## 2017-05-22 DIAGNOSIS — Z91013 Allergy to seafood: Secondary | ICD-10-CM | POA: Insufficient documentation

## 2017-05-22 DIAGNOSIS — Z79899 Other long term (current) drug therapy: Secondary | ICD-10-CM | POA: Insufficient documentation

## 2017-05-22 DIAGNOSIS — I10 Essential (primary) hypertension: Secondary | ICD-10-CM | POA: Diagnosis not present

## 2017-05-22 DIAGNOSIS — K5790 Diverticulosis of intestine, part unspecified, without perforation or abscess without bleeding: Secondary | ICD-10-CM | POA: Diagnosis not present

## 2017-05-22 HISTORY — PX: COLONOSCOPY WITH PROPOFOL: SHX5780

## 2017-05-22 LAB — CBC WITH DIFFERENTIAL/PLATELET
Basophils Absolute: 0 10*3/uL (ref 0–0.1)
Basophils Relative: 1 %
Eosinophils Absolute: 0.1 10*3/uL (ref 0–0.7)
Eosinophils Relative: 3 %
HCT: 39.9 % — ABNORMAL LOW (ref 40.0–52.0)
HEMOGLOBIN: 13.7 g/dL (ref 13.0–18.0)
LYMPHS ABS: 0.7 10*3/uL — AB (ref 1.0–3.6)
LYMPHS PCT: 19 %
MCH: 29.5 pg (ref 26.0–34.0)
MCHC: 34.2 g/dL (ref 32.0–36.0)
MCV: 86.3 fL (ref 80.0–100.0)
MONO ABS: 0.5 10*3/uL (ref 0.2–1.0)
MONOS PCT: 13 %
NEUTROS PCT: 64 %
Neutro Abs: 2.4 10*3/uL (ref 1.4–6.5)
Platelets: 160 10*3/uL (ref 150–440)
RBC: 4.63 MIL/uL (ref 4.40–5.90)
RDW: 13.8 % (ref 11.5–14.5)
WBC: 3.7 10*3/uL — ABNORMAL LOW (ref 3.8–10.6)

## 2017-05-22 LAB — GLUCOSE, CAPILLARY: Glucose-Capillary: 154 mg/dL — ABNORMAL HIGH (ref 65–99)

## 2017-05-22 SURGERY — COLONOSCOPY WITH PROPOFOL
Anesthesia: General

## 2017-05-22 MED ORDER — LIDOCAINE HCL (PF) 1 % IJ SOLN
INTRAMUSCULAR | Status: AC
  Administered 2017-05-22: 0.3 mL via INTRADERMAL
  Filled 2017-05-22: qty 2

## 2017-05-22 MED ORDER — GLYCOPYRROLATE 0.2 MG/ML IJ SOLN
INTRAMUSCULAR | Status: AC
  Filled 2017-05-22: qty 1

## 2017-05-22 MED ORDER — PROPOFOL 500 MG/50ML IV EMUL
INTRAVENOUS | Status: DC | PRN
  Administered 2017-05-22: 120 ug/kg/min via INTRAVENOUS

## 2017-05-22 MED ORDER — PROPOFOL 10 MG/ML IV BOLUS
INTRAVENOUS | Status: AC
  Filled 2017-05-22: qty 20

## 2017-05-22 MED ORDER — LIDOCAINE HCL (PF) 1 % IJ SOLN
2.0000 mL | Freq: Once | INTRAMUSCULAR | Status: AC
  Administered 2017-05-22: 0.3 mL via INTRADERMAL

## 2017-05-22 MED ORDER — PROPOFOL 10 MG/ML IV BOLUS
INTRAVENOUS | Status: DC | PRN
  Administered 2017-05-22: 30 mg via INTRAVENOUS
  Administered 2017-05-22: 20 mg via INTRAVENOUS

## 2017-05-22 MED ORDER — SODIUM CHLORIDE 0.9 % IV SOLN: INTRAVENOUS | Status: DC

## 2017-05-22 MED ORDER — SODIUM CHLORIDE 0.9 % IV SOLN
INTRAVENOUS | Status: DC
  Administered 2017-05-22: 1000 mL via INTRAVENOUS

## 2017-05-22 NOTE — Op Note (Signed)
[facility] Gastroenterology Patient Name: Alexander Rogers Procedure Date: 05/22/2017 9:11 AM MRN: [id number] Account #: [id number] Date of Birth: [birth year] Admit Type: Outpatient Age: 61 Room: [facility] ENDO ROOM 3 Gender: Male Note Status: Finalized Procedure:            Colonoscopy Indications:          Personal history of colonic polyps Providers:            Christena Deem, MD Referring MD:         Leotis Shames (Referring MD) Medicines:            Monitored Anesthesia Care Complications:        No immediate complications. Procedure:            Pre-Anesthesia Assessment:                       - ASA Grade Assessment: III - A patient with severe                        systemic disease.                       After obtaining informed consent, the colonoscope was                        passed under direct vision. Throughout the procedure,                        the patient's blood pressure, pulse, and oxygen                        saturations were monitored continuously. The                        Colonoscope was introduced through the anus and                        advanced to the the cecum, identified by appendiceal                        orifice and ileocecal valve. The colonoscopy was                        performed without difficulty. The patient tolerated the                        procedure well. The quality of the bowel preparation                        was good. Findings:      Multiple medium-mouthed diverticula were found in the sigmoid colon and       distal descending colon.      The retroflexed view of the distal rectum and anal verge was normal and       showed no anal or rectal abnormalities.      The digital rectal exam was normal. Impression:           - Diverticulosis in the sigmoid colon and in the distal                        descending colon.                       -  The distal rectum and anal verge are normal on   retroflexion view.                       - No specimens collected. Recommendation:       - Discharge patient to home.                       - Advance diet as tolerated.                       - Repeat colonoscopy in 5 years for surveillance. Procedure Code(s):    --- Professional ---                       [id number], Colonoscopy, flexible; diagnostic, including                        collection of specimen(s) by brushing or washing, when                        performed (separate procedure) Diagnosis Code(s):    --- Professional ---                       Z86.010, Personal history of colonic polyps                       K57.30, Diverticulosis of large intestine without                        perforation or abscess without bleeding CPT copyright 2016 American Medical Association. All rights reserved. The codes documented in this report are preliminary and upon coder review may  be revised to meet current compliance requirements. Christena DeemMartin U Shalan Neault, MD 05/22/2017 9:46:36 AM This report has been signed electronically. Number of Addenda: 0 Note Initiated On: 05/22/2017 9:11 AM Scope Withdrawal Time: 0 hours 5 minutes 53 seconds  Total Procedure Duration: 0 hours 25 minutes 51 seconds       [facility]

## 2017-05-22 NOTE — Anesthesia Post-op Follow-up Note (Signed)
Anesthesia QCDR form completed.        

## 2017-05-22 NOTE — H&P (Signed)
Outpatient short stay form Pre-procedure 05/22/2017 8:44 AM Alexander Rogers Alexander Truxillo MD  Primary Physician: Dr. Leotis ShamesJasmine Rogers  Reason for visit:  Colonoscopy  History of present illness:  Patient is a 61 year old male presenting today as above. Has a personal history of adenomatous colon polyps with his last colonoscopy being in July 2012. He tolerated his prep well. He takes no aspirin or blood thinning agents.    Current Facility-Administered Medications:  .  0.9 %  sodium chloride infusion, , Intravenous, Continuous, Alexander Rogers, Alexander Ruan U, MD, Last Rate: 20 mL/hr at 05/22/17 0843, 1,000 mL at 05/22/17 0843 .  0.9 %  sodium chloride infusion, , Intravenous, Continuous, Alexander Rogers, Alexander Badie U, MD  Prescriptions Prior to Admission  Medication Sig Dispense Refill Last Dose  . Budesonide (PULMICORT FLEXHALER) 90 MCG/ACT inhaler Inhale into the lungs.   05/21/2017 at Unknown time  . albuterol (PROVENTIL HFA;VENTOLIN HFA) 108 (90 BASE) MCG/ACT inhaler Inhale 2 puffs into the lungs 4 (four) times daily as needed.   Taking  . Azelastine-Fluticasone (DYMISTA) 137-50 MCG/ACT SUSP Place 1 spray into the nose daily.   Taking  . escitalopram (LEXAPRO) 20 MG tablet Take 20 mg by mouth daily.    Taking  . glucose blood test strip    Taking  . IRON PO Take by mouth.   Taking  . losartan (COZAAR) 100 MG tablet Take 100 mg by mouth daily.    Taking  . meloxicam (MOBIC) 15 MG tablet Take 15 mg by mouth daily as needed.   Taking  . metFORMIN (GLUCOPHAGE-XR) 500 MG 24 hr tablet Take 1,000 mg by mouth daily with breakfast.    Taking  . montelukast (SINGULAIR) 10 MG tablet Take 10 mg by mouth at bedtime.   Taking  . pramipexole (MIRAPEX) 0.25 MG tablet Take 0.25 mg by mouth daily.    Taking     Allergies  Allergen Reactions  . Fish Allergy Other (See Comments)    Was told to avoid shell fish; able to eat some fish  . Tomato Rash     Past Medical History:  Diagnosis Date  . Allergy    Seasonal  . Arthritis    . Asthma   . Decreased white blood cell count   . GAD (generalized anxiety disorder)   . History of meniscal tear   . History of pneumonia   . Hyperlipidemia   . Hypertension   . Leukopenia   . Restless leg syndrome   . Sleep apnea    on CPAP  . Thrombocytopenia (HCC)     Review of systems:      Physical Exam    Heart and lungs: Regular rate and rhythm without rub or gallop, lungs are bilaterally clear    HEENT: Normocephalic atraumatic eyes are anicteric    Other:     Pertinant exam for procedure: Soft nontender nondistended bowel sounds positive normoactive.    Planned proceedures: Colonoscopy and indicated procedures. I have discussed the risks benefits and complications of procedures to include not limited to bleeding, infection, perforation and the risk of sedation and the patient wishes to proceed.  It is of note patient has a history of thrombocytopenia as well as leukopenia. We will check a CBC with differential prior to procedure today.    Alexander Rogers Alexander Fifita, MD Gastroenterology 05/22/2017  8:44 AM

## 2017-05-22 NOTE — Anesthesia Preprocedure Evaluation (Signed)
Anesthesia Evaluation  Patient identified by MRN, date of birth, ID band Patient awake    Reviewed: Allergy & Precautions, NPO status , Patient's Chart, lab work & pertinent test results  Airway Mallampati: II       Dental  (+) Teeth Intact   Pulmonary asthma , sleep apnea and Continuous Positive Airway Pressure Ventilation ,     + decreased breath sounds      Cardiovascular hypertension, Pt. on medications  Rhythm:Regular Rate:Normal     Neuro/Psych Anxiety    GI/Hepatic negative GI ROS, Neg liver ROS,   Endo/Other  diabetes, Type 2, Oral Hypoglycemic AgentsMorbid obesity  Renal/GU negative Renal ROS     Musculoskeletal   Abdominal (+) + obese,   Peds negative pediatric ROS (+)  Hematology   Anesthesia Other Findings   Reproductive/Obstetrics                             Anesthesia Physical Anesthesia Plan  ASA: II  Anesthesia Plan: General   Post-op Pain Management:    Induction: Intravenous  Airway Management Planned: Natural Airway and Nasal Cannula  Additional Equipment:   Intra-op Plan:   Post-operative Plan:   Informed Consent: I have reviewed the patients History and Physical, chart, labs and discussed the procedure including the risks, benefits and alternatives for the proposed anesthesia with the patient or authorized representative who has indicated his/her understanding and acceptance.     Plan Discussed with: Surgeon  Anesthesia Plan Comments:         Anesthesia Quick Evaluation

## 2017-05-22 NOTE — Transfer of Care (Signed)
Immediate Anesthesia Transfer of Care Note  Patient: Alexander DubinWilliam F Schlender  Procedure(s) Performed: Procedure(s): COLONOSCOPY WITH PROPOFOL (N/A)  Patient Location: PACU  Anesthesia Type:General  Level of Consciousness: awake  Airway & Oxygen Therapy: Patient Spontanous Breathing and Patient connected to nasal cannula oxygen  Post-op Assessment: Report given to RN and Post -op Vital signs reviewed and stable  Post vital signs: Reviewed  Last Vitals:  Vitals:   05/22/17 0830  BP: (!) 158/94  Pulse: 68  Resp: 17  Temp: 36.6 C    Last Pain:  Vitals:   05/22/17 0830  TempSrc: Tympanic         Complications: No apparent anesthesia complications

## 2017-05-23 ENCOUNTER — Encounter: Payer: Self-pay | Admitting: Gastroenterology

## 2017-05-23 NOTE — Anesthesia Postprocedure Evaluation (Signed)
Anesthesia Post Note  Patient: Alexander Rogers  Procedure(s) Performed: Procedure(s) (LRB): COLONOSCOPY WITH PROPOFOL (N/A)  Patient location during evaluation: PACU Anesthesia Type: General Level of consciousness: awake Pain management: pain level controlled Vital Signs Assessment: post-procedure vital signs reviewed and stable Respiratory status: spontaneous breathing Cardiovascular status: stable Anesthetic complications: no     Last Vitals:  Vitals:   05/22/17 0950 05/22/17 1000  BP: 132/86 139/87  Pulse: (!) 57 60  Resp: (!) 22 14  Temp:      Last Pain:  Vitals:   05/22/17 0830  TempSrc: Tympanic                 VAN STAVEREN,Modene Andy

## 2017-05-24 DIAGNOSIS — I10 Essential (primary) hypertension: Secondary | ICD-10-CM | POA: Diagnosis not present

## 2017-05-24 DIAGNOSIS — E119 Type 2 diabetes mellitus without complications: Secondary | ICD-10-CM | POA: Diagnosis not present

## 2017-05-24 DIAGNOSIS — Z125 Encounter for screening for malignant neoplasm of prostate: Secondary | ICD-10-CM | POA: Diagnosis not present

## 2017-05-29 DIAGNOSIS — E119 Type 2 diabetes mellitus without complications: Secondary | ICD-10-CM | POA: Diagnosis not present

## 2017-05-29 DIAGNOSIS — Z0000 Encounter for general adult medical examination without abnormal findings: Secondary | ICD-10-CM | POA: Diagnosis not present

## 2017-05-29 DIAGNOSIS — J4540 Moderate persistent asthma, uncomplicated: Secondary | ICD-10-CM | POA: Diagnosis not present

## 2017-07-12 DIAGNOSIS — J4520 Mild intermittent asthma, uncomplicated: Secondary | ICD-10-CM | POA: Diagnosis not present

## 2017-07-12 DIAGNOSIS — G4733 Obstructive sleep apnea (adult) (pediatric): Secondary | ICD-10-CM | POA: Diagnosis not present

## 2017-07-12 DIAGNOSIS — G2581 Restless legs syndrome: Secondary | ICD-10-CM | POA: Diagnosis not present

## 2017-09-03 DIAGNOSIS — E119 Type 2 diabetes mellitus without complications: Secondary | ICD-10-CM | POA: Diagnosis not present

## 2017-09-10 DIAGNOSIS — E119 Type 2 diabetes mellitus without complications: Secondary | ICD-10-CM | POA: Diagnosis not present

## 2017-09-10 DIAGNOSIS — I10 Essential (primary) hypertension: Secondary | ICD-10-CM | POA: Diagnosis not present

## 2017-09-26 DIAGNOSIS — J22 Unspecified acute lower respiratory infection: Secondary | ICD-10-CM | POA: Diagnosis not present

## 2017-10-14 DIAGNOSIS — J019 Acute sinusitis, unspecified: Secondary | ICD-10-CM | POA: Diagnosis not present

## 2017-10-14 DIAGNOSIS — B9689 Other specified bacterial agents as the cause of diseases classified elsewhere: Secondary | ICD-10-CM | POA: Diagnosis not present

## 2017-10-14 DIAGNOSIS — J209 Acute bronchitis, unspecified: Secondary | ICD-10-CM | POA: Diagnosis not present

## 2017-12-07 DIAGNOSIS — E119 Type 2 diabetes mellitus without complications: Secondary | ICD-10-CM | POA: Diagnosis not present

## 2017-12-13 DIAGNOSIS — I10 Essential (primary) hypertension: Secondary | ICD-10-CM | POA: Diagnosis not present

## 2017-12-13 DIAGNOSIS — G2581 Restless legs syndrome: Secondary | ICD-10-CM | POA: Diagnosis not present

## 2017-12-13 DIAGNOSIS — E119 Type 2 diabetes mellitus without complications: Secondary | ICD-10-CM | POA: Diagnosis not present

## 2018-01-01 DIAGNOSIS — H43813 Vitreous degeneration, bilateral: Secondary | ICD-10-CM | POA: Diagnosis not present

## 2018-01-01 DIAGNOSIS — E119 Type 2 diabetes mellitus without complications: Secondary | ICD-10-CM | POA: Diagnosis not present

## 2018-01-07 DIAGNOSIS — G4733 Obstructive sleep apnea (adult) (pediatric): Secondary | ICD-10-CM | POA: Diagnosis not present

## 2018-01-07 DIAGNOSIS — J3089 Other allergic rhinitis: Secondary | ICD-10-CM | POA: Diagnosis not present

## 2018-01-07 DIAGNOSIS — J4540 Moderate persistent asthma, uncomplicated: Secondary | ICD-10-CM | POA: Diagnosis not present

## 2018-01-22 ENCOUNTER — Other Ambulatory Visit: Payer: Self-pay

## 2018-01-22 DIAGNOSIS — I10 Essential (primary) hypertension: Secondary | ICD-10-CM

## 2018-01-23 ENCOUNTER — Encounter: Payer: Self-pay | Admitting: Internal Medicine

## 2018-01-23 ENCOUNTER — Inpatient Hospital Stay: Payer: 59

## 2018-01-23 ENCOUNTER — Inpatient Hospital Stay: Payer: 59 | Attending: Internal Medicine | Admitting: Internal Medicine

## 2018-01-23 VITALS — BP 127/80 | HR 76 | Temp 97.5°F | Wt 257.2 lb

## 2018-01-23 DIAGNOSIS — I10 Essential (primary) hypertension: Secondary | ICD-10-CM

## 2018-01-23 DIAGNOSIS — D72810 Lymphocytopenia: Secondary | ICD-10-CM | POA: Diagnosis not present

## 2018-01-23 DIAGNOSIS — Z79899 Other long term (current) drug therapy: Secondary | ICD-10-CM | POA: Diagnosis not present

## 2018-01-23 DIAGNOSIS — R7989 Other specified abnormal findings of blood chemistry: Secondary | ICD-10-CM | POA: Diagnosis not present

## 2018-01-23 DIAGNOSIS — E785 Hyperlipidemia, unspecified: Secondary | ICD-10-CM | POA: Diagnosis not present

## 2018-01-23 DIAGNOSIS — G2581 Restless legs syndrome: Secondary | ICD-10-CM | POA: Insufficient documentation

## 2018-01-23 DIAGNOSIS — G4730 Sleep apnea, unspecified: Secondary | ICD-10-CM | POA: Diagnosis not present

## 2018-01-23 DIAGNOSIS — D696 Thrombocytopenia, unspecified: Secondary | ICD-10-CM | POA: Diagnosis not present

## 2018-01-23 DIAGNOSIS — J45909 Unspecified asthma, uncomplicated: Secondary | ICD-10-CM | POA: Insufficient documentation

## 2018-01-23 LAB — CBC WITH DIFFERENTIAL/PLATELET
Basophils Absolute: 0 10*3/uL (ref 0–0.1)
Basophils Relative: 1 %
EOS ABS: 0.1 10*3/uL (ref 0–0.7)
Eosinophils Relative: 4 %
HEMATOCRIT: 43.7 % (ref 40.0–52.0)
HEMOGLOBIN: 14.6 g/dL (ref 13.0–18.0)
LYMPHS ABS: 0.8 10*3/uL — AB (ref 1.0–3.6)
LYMPHS PCT: 21 %
MCH: 29.5 pg (ref 26.0–34.0)
MCHC: 33.3 g/dL (ref 32.0–36.0)
MCV: 88.4 fL (ref 80.0–100.0)
MONOS PCT: 14 %
Monocytes Absolute: 0.5 10*3/uL (ref 0.2–1.0)
NEUTROS ABS: 2.2 10*3/uL (ref 1.4–6.5)
NEUTROS PCT: 60 %
Platelets: 166 10*3/uL (ref 150–440)
RBC: 4.95 MIL/uL (ref 4.40–5.90)
RDW: 13.5 % (ref 11.5–14.5)
WBC: 3.7 10*3/uL — AB (ref 3.8–10.6)

## 2018-01-23 LAB — BASIC METABOLIC PANEL
Anion gap: 10 (ref 5–15)
BUN: 21 mg/dL — ABNORMAL HIGH (ref 6–20)
CHLORIDE: 100 mmol/L — AB (ref 101–111)
CO2: 26 mmol/L (ref 22–32)
CREATININE: 0.82 mg/dL (ref 0.61–1.24)
Calcium: 9.9 mg/dL (ref 8.9–10.3)
GFR calc non Af Amer: >60 mL/min (ref >60)
Glucose, Bld: 144 mg/dL — ABNORMAL HIGH (ref 65–99)
POTASSIUM: 4.5 mmol/L (ref 3.5–5.1)
SODIUM: 136 mmol/L (ref 135–145)

## 2018-01-23 NOTE — Assessment & Plan Note (Addendum)
#  Mild leukopenia-asymptomatic/mild lymphopenia ALC-Stable. Monitor for now.   # Intermittent thrombocytopenia- Stable/resolved: today is 161  #Intermittent elevated LFTs-question Nash.  # I suspect patient has a benign hematologic problem/rather than malignant-as his counts have been stable for the last few years.  I would not recommend any bone marrow biopsy unless counts worsen.    #Patient will follow-up with me as needed. patient agrees to follow-up with PCP on a regular basis.  CC; Dr.Singh

## 2018-01-23 NOTE — Progress Notes (Signed)
[place] OFFICE PROGRESS NOTE  Patient Care Team: Glendon Axe, MD as PCP - General (Internal Medicine)  Cancer Staging No matching staging information was found for the patient.    No history exists.      # April 2016- MILD non-spec PERSISTENT LEUKOPENIA [2.6-3.2]; MILD Thrombocytopenia [130s-140s]  # Fatty liver [March 2016]; Asthma/OSA   INTERVAL HISTORY:    62 year old male patient with above history of  Mild leukopenia asymptomatic;  And also mild thrombocytopenia is here for follow-up.  Patient denies any frequent infections. Denies any  Easy bruising or bleeding.  He continues to have chronic fatigue.   No nausea no vomiting  Or abdominal pain.    REVIEW OF SYSTEMS:  A complete 10 point review of system is done which is negative except mentioned above/history of present illness.   PAST MEDICAL HISTORY :  Past Medical History:  Diagnosis Date  . Allergy    Seasonal  . Arthritis   . Asthma   . Decreased white blood cell count   . GAD (generalized anxiety disorder)   . History of meniscal tear   . History of pneumonia   . Hyperlipidemia   . Hypertension   . Leukopenia   . Restless leg syndrome   . Sleep apnea    on CPAP  . Thrombocytopenia ([place])     PAST SURGICAL HISTORY :   Past Surgical History:  Procedure Laterality Date  . COLONOSCOPY  06/29/2011  . COLONOSCOPY WITH PROPOFOL N/A 05/22/2017   Procedure: COLONOSCOPY WITH PROPOFOL;  Surgeon: Lollie Sails, MD;  Location: [facility] ENDOSCOPY;  Service: Endoscopy;  Laterality: N/A;  . ELBOW ARTHROSCOPY WITH TENDON RECONSTRUCTION Right   . EXCISIONAL HEMORRHOIDECTOMY    . HERNIA REPAIR    . MENISCUS REPAIR Left    Knee surgery for meniscus tear left side  . TONSILLECTOMY    . VASECTOMY  1989    FAMILY HISTORY :   Family History  Problem Relation Age of Onset  . Hypertension Unknown   . Asthma Unknown   . Anemia Unknown   . Arthritis Unknown   . Kidney cancer Unknown   .  Lung cancer Unknown   . Prostate cancer Father   . Skin cancer Father   . Hypertension Father   . Stomach cancer Unknown   . Arthritis Mother   . COPD Mother   . Asthma Mother   . Asthma Sister     SOCIAL HISTORY:   Social History   Tobacco Use  . Smoking status: Never Smoker  . Smokeless tobacco: Never Used  Substance Use Topics  . Alcohol use: Yes    Alcohol/week: 1.8 oz    Types: 3 Cans of beer per week    Comment: Took alcohol on a regular basis at younger age, up until 51s, now occasional intake only  . Drug use: No    ALLERGIES:  is allergic to fish allergy and tomato.  MEDICATIONS:  Current Outpatient Medications  Medication Sig Dispense Refill  . albuterol (PROVENTIL HFA;VENTOLIN HFA) 108 (90 BASE) MCG/ACT inhaler Inhale 2 puffs into the lungs 4 (four) times daily as needed.    . Azelastine-Fluticasone (DYMISTA) 137-50 MCG/ACT SUSP Place 1 spray into the nose daily.    . Budesonide (PULMICORT FLEXHALER) 90 MCG/ACT inhaler Inhale into the lungs.    Marland Kitchen escitalopram (LEXAPRO) 20 MG tablet Take 20 mg by mouth daily.     Marland Kitchen glucose blood test strip     . IRON  PO Take by mouth.    . losartan (COZAAR) 100 MG tablet Take 100 mg by mouth daily.     . metFORMIN (GLUCOPHAGE-XR) 500 MG 24 hr tablet Take 1,000 mg by mouth daily with breakfast.     . pramipexole (MIRAPEX) 0.25 MG tablet Take 0.25 mg by mouth daily.     . sitaGLIPtin (JANUVIA) 50 MG tablet Take 50 mg by mouth daily.    . meloxicam (MOBIC) 15 MG tablet Take 15 mg by mouth daily as needed.    . montelukast (SINGULAIR) 10 MG tablet Take 10 mg by mouth at bedtime.     No current facility-administered medications for this visit.     PHYSICAL EXAMINATION: ECOG PERFORMANCE STATUS: 0 - Asymptomatic  BP 127/80 (BP Location: Left Arm, Patient Position: Sitting)   Pulse 76   Temp (!) 97.5 F (36.4 C) (Oral)   Wt 257 lb 3.2 oz (116.7 kg)   SpO2 96%   BMI 37.98 kg/m   Filed Weights   01/23/18 1413  Weight: 257  lb 3.2 oz (116.7 kg)    GENERAL: Well-nourished well-developed; Alert, no distress and comfortable.   Alone. EYES: no pallor or icterus OROPHARYNX: no thrush or ulceration; good dentition  NECK: supple, no masses felt LYMPH:  no palpable lymphadenopathy in the cervical, axillary or inguinal regions LUNGS: clear to auscultation and  No wheeze or crackles HEART/CVS: regular rate & rhythm and no murmurs; No lower extremity edema ABDOMEN:abdomen soft, non-tender and normal bowel sounds Musculoskeletal:no cyanosis of digits and no clubbing  PSYCH: alert & oriented x 3 with fluent speech NEURO: no focal motor/sensory deficits SKIN:  no rashes or significant lesions  LABORATORY DATA:  I have reviewed the data as listed    Component Value Date/Time   NA 136 01/23/2018 1402   K 4.5 01/23/2018 1402   CL 100 (L) 01/23/2018 1402   CO2 26 01/23/2018 1402   GLUCOSE 144 (H) 01/23/2018 1402   BUN 21 (H) 01/23/2018 1402   CREATININE 0.82 01/23/2018 1402   CALCIUM 9.9 01/23/2018 1402   PROT 7.8 01/22/2017 1410   ALBUMIN 4.4 01/22/2017 1410   AST 67 (H) 01/22/2017 1410   ALT 92 (H) 01/22/2017 1410   ALKPHOS 58 01/22/2017 1410   BILITOT 1.0 01/22/2017 1410   GFRNONAA >60 01/23/2018 1402   GFRAA >60 01/23/2018 1402    No results found for: SPEP, UPEP  Lab Results  Component Value Date   WBC 3.7 (L) 01/23/2018   NEUTROABS 2.2 01/23/2018   HGB 14.6 01/23/2018   HCT 43.7 01/23/2018   MCV 88.4 01/23/2018   PLT 166 01/23/2018      Chemistry      Component Value Date/Time   NA 136 01/23/2018 1402   K 4.5 01/23/2018 1402   CL 100 (L) 01/23/2018 1402   CO2 26 01/23/2018 1402   BUN 21 (H) 01/23/2018 1402   CREATININE 0.82 01/23/2018 1402      Component Value Date/Time   CALCIUM 9.9 01/23/2018 1402   ALKPHOS 58 01/22/2017 1410   AST 67 (H) 01/22/2017 1410   ALT 92 (H) 01/22/2017 1410   BILITOT 1.0 01/22/2017 1410       RADIOGRAPHIC STUDIES: I have personally reviewed the  radiological images as listed and agreed with the findings in the report. No results found.   ASSESSMENT & PLAN:  Thrombocytopenia ([place]) # Mild leukopenia-asymptomatic/mild lymphopenia ALC-Stable. Monitor for now.   # Intermittent thrombocytopenia- Stable/resolved: today is 161  #Intermittent  elevated LFTs-question Nash.  # I suspect patient has a benign hematologic problem/rather than malignant-as his counts have been stable for the last few years.  I would not recommend any bone marrow biopsy unless counts worsen.    #Patient will follow-up with me as needed. patient agrees to follow-up with PCP on a regular basis.  CC; Dr.Singh      No orders of the defined types were placed in this encounter.  All questions were answered. The patient knows to call the clinic with any problems, questions or concerns.      Cammie Sickle, MD 01/24/2018 9:22 PM

## 2018-02-20 DIAGNOSIS — Z9181 History of falling: Secondary | ICD-10-CM | POA: Diagnosis not present

## 2018-02-20 DIAGNOSIS — S59902A Unspecified injury of left elbow, initial encounter: Secondary | ICD-10-CM | POA: Diagnosis not present

## 2018-02-20 DIAGNOSIS — S40022A Contusion of left upper arm, initial encounter: Secondary | ICD-10-CM | POA: Diagnosis not present

## 2018-03-08 DIAGNOSIS — E119 Type 2 diabetes mellitus without complications: Secondary | ICD-10-CM | POA: Diagnosis not present

## 2018-03-15 DIAGNOSIS — E119 Type 2 diabetes mellitus without complications: Secondary | ICD-10-CM | POA: Diagnosis not present

## 2018-03-15 DIAGNOSIS — I10 Essential (primary) hypertension: Secondary | ICD-10-CM | POA: Diagnosis not present

## 2018-04-15 DIAGNOSIS — L02421 Furuncle of right axilla: Secondary | ICD-10-CM | POA: Diagnosis not present

## 2018-05-10 DIAGNOSIS — J069 Acute upper respiratory infection, unspecified: Secondary | ICD-10-CM | POA: Diagnosis not present

## 2018-05-10 DIAGNOSIS — R05 Cough: Secondary | ICD-10-CM | POA: Diagnosis not present

## 2018-05-10 DIAGNOSIS — J45998 Other asthma: Secondary | ICD-10-CM | POA: Diagnosis not present

## 2018-05-25 DIAGNOSIS — B9689 Other specified bacterial agents as the cause of diseases classified elsewhere: Secondary | ICD-10-CM | POA: Diagnosis not present

## 2018-05-25 DIAGNOSIS — J019 Acute sinusitis, unspecified: Secondary | ICD-10-CM | POA: Diagnosis not present

## 2018-05-25 DIAGNOSIS — J209 Acute bronchitis, unspecified: Secondary | ICD-10-CM | POA: Diagnosis not present

## 2018-07-08 DIAGNOSIS — J4540 Moderate persistent asthma, uncomplicated: Secondary | ICD-10-CM | POA: Diagnosis not present

## 2018-07-08 DIAGNOSIS — G4733 Obstructive sleep apnea (adult) (pediatric): Secondary | ICD-10-CM | POA: Diagnosis not present

## 2018-07-26 DIAGNOSIS — Z125 Encounter for screening for malignant neoplasm of prostate: Secondary | ICD-10-CM | POA: Diagnosis not present

## 2018-07-26 DIAGNOSIS — E119 Type 2 diabetes mellitus without complications: Secondary | ICD-10-CM | POA: Diagnosis not present

## 2018-09-03 DIAGNOSIS — Z0000 Encounter for general adult medical examination without abnormal findings: Secondary | ICD-10-CM | POA: Diagnosis not present

## 2018-09-03 DIAGNOSIS — E119 Type 2 diabetes mellitus without complications: Secondary | ICD-10-CM | POA: Diagnosis not present

## 2018-09-03 DIAGNOSIS — I10 Essential (primary) hypertension: Secondary | ICD-10-CM | POA: Diagnosis not present

## 2018-10-29 DIAGNOSIS — I10 Essential (primary) hypertension: Secondary | ICD-10-CM | POA: Diagnosis not present

## 2018-10-29 DIAGNOSIS — E119 Type 2 diabetes mellitus without complications: Secondary | ICD-10-CM | POA: Diagnosis not present

## 2018-10-29 DIAGNOSIS — J209 Acute bronchitis, unspecified: Secondary | ICD-10-CM | POA: Diagnosis not present

## 2018-12-04 DIAGNOSIS — I10 Essential (primary) hypertension: Secondary | ICD-10-CM | POA: Diagnosis not present

## 2018-12-04 DIAGNOSIS — E119 Type 2 diabetes mellitus without complications: Secondary | ICD-10-CM | POA: Diagnosis not present

## 2018-12-11 DIAGNOSIS — E119 Type 2 diabetes mellitus without complications: Secondary | ICD-10-CM | POA: Diagnosis not present

## 2019-01-06 DIAGNOSIS — G4733 Obstructive sleep apnea (adult) (pediatric): Secondary | ICD-10-CM | POA: Diagnosis not present

## 2019-01-06 DIAGNOSIS — J4520 Mild intermittent asthma, uncomplicated: Secondary | ICD-10-CM | POA: Diagnosis not present

## 2019-01-22 DIAGNOSIS — E119 Type 2 diabetes mellitus without complications: Secondary | ICD-10-CM | POA: Diagnosis not present

## 2019-04-22 DIAGNOSIS — E119 Type 2 diabetes mellitus without complications: Secondary | ICD-10-CM | POA: Diagnosis not present

## 2019-04-29 DIAGNOSIS — E119 Type 2 diabetes mellitus without complications: Secondary | ICD-10-CM | POA: Diagnosis not present

## 2019-04-29 DIAGNOSIS — J4540 Moderate persistent asthma, uncomplicated: Secondary | ICD-10-CM | POA: Diagnosis not present

## 2019-04-29 DIAGNOSIS — I10 Essential (primary) hypertension: Secondary | ICD-10-CM | POA: Diagnosis not present

## 2021-01-21 DIAGNOSIS — J029 Acute pharyngitis, unspecified: Secondary | ICD-10-CM | POA: Diagnosis not present

## 2021-01-21 DIAGNOSIS — R0981 Nasal congestion: Secondary | ICD-10-CM | POA: Diagnosis not present

## 2021-01-21 DIAGNOSIS — R519 Headache, unspecified: Secondary | ICD-10-CM | POA: Diagnosis not present

## 2021-01-21 DIAGNOSIS — R5383 Other fatigue: Secondary | ICD-10-CM | POA: Diagnosis not present

## 2021-02-25 DIAGNOSIS — E119 Type 2 diabetes mellitus without complications: Secondary | ICD-10-CM | POA: Diagnosis not present

## 2021-03-10 DIAGNOSIS — F325 Major depressive disorder, single episode, in full remission: Secondary | ICD-10-CM | POA: Diagnosis not present

## 2021-03-10 DIAGNOSIS — G4733 Obstructive sleep apnea (adult) (pediatric): Secondary | ICD-10-CM | POA: Diagnosis not present

## 2021-03-10 DIAGNOSIS — F419 Anxiety disorder, unspecified: Secondary | ICD-10-CM | POA: Diagnosis not present

## 2021-03-10 DIAGNOSIS — J4540 Moderate persistent asthma, uncomplicated: Secondary | ICD-10-CM | POA: Diagnosis not present

## 2021-03-10 DIAGNOSIS — E119 Type 2 diabetes mellitus without complications: Secondary | ICD-10-CM | POA: Diagnosis not present

## 2021-03-10 DIAGNOSIS — G47419 Narcolepsy without cataplexy: Secondary | ICD-10-CM | POA: Diagnosis not present

## 2021-03-10 DIAGNOSIS — Z0000 Encounter for general adult medical examination without abnormal findings: Secondary | ICD-10-CM | POA: Diagnosis not present

## 2021-03-10 DIAGNOSIS — Z125 Encounter for screening for malignant neoplasm of prostate: Secondary | ICD-10-CM | POA: Diagnosis not present

## 2021-03-10 DIAGNOSIS — I10 Essential (primary) hypertension: Secondary | ICD-10-CM | POA: Diagnosis not present

## 2021-03-10 DIAGNOSIS — D696 Thrombocytopenia, unspecified: Secondary | ICD-10-CM | POA: Diagnosis not present

## 2021-03-18 DIAGNOSIS — J019 Acute sinusitis, unspecified: Secondary | ICD-10-CM | POA: Diagnosis not present

## 2021-03-18 DIAGNOSIS — R059 Cough, unspecified: Secondary | ICD-10-CM | POA: Diagnosis not present

## 2021-03-18 DIAGNOSIS — B9689 Other specified bacterial agents as the cause of diseases classified elsewhere: Secondary | ICD-10-CM | POA: Diagnosis not present

## 2021-04-05 DIAGNOSIS — E663 Overweight: Secondary | ICD-10-CM | POA: Diagnosis not present

## 2021-04-05 DIAGNOSIS — J310 Chronic rhinitis: Secondary | ICD-10-CM | POA: Diagnosis not present

## 2021-04-05 DIAGNOSIS — G4733 Obstructive sleep apnea (adult) (pediatric): Secondary | ICD-10-CM | POA: Diagnosis not present

## 2021-04-05 DIAGNOSIS — J4540 Moderate persistent asthma, uncomplicated: Secondary | ICD-10-CM | POA: Diagnosis not present

## 2021-04-05 DIAGNOSIS — Z01818 Encounter for other preprocedural examination: Secondary | ICD-10-CM | POA: Diagnosis not present

## 2021-04-15 DIAGNOSIS — L03039 Cellulitis of unspecified toe: Secondary | ICD-10-CM | POA: Diagnosis not present

## 2021-04-15 DIAGNOSIS — S92515A Nondisplaced fracture of proximal phalanx of left lesser toe(s), initial encounter for closed fracture: Secondary | ICD-10-CM | POA: Diagnosis not present

## 2021-04-15 DIAGNOSIS — M79675 Pain in left toe(s): Secondary | ICD-10-CM | POA: Diagnosis not present

## 2021-09-08 DIAGNOSIS — Z125 Encounter for screening for malignant neoplasm of prostate: Secondary | ICD-10-CM | POA: Diagnosis not present

## 2021-09-08 DIAGNOSIS — E119 Type 2 diabetes mellitus without complications: Secondary | ICD-10-CM | POA: Diagnosis not present

## 2021-09-15 DIAGNOSIS — Z23 Encounter for immunization: Secondary | ICD-10-CM | POA: Diagnosis not present

## 2021-09-15 DIAGNOSIS — E119 Type 2 diabetes mellitus without complications: Secondary | ICD-10-CM | POA: Diagnosis not present

## 2021-09-15 DIAGNOSIS — F325 Major depressive disorder, single episode, in full remission: Secondary | ICD-10-CM | POA: Diagnosis not present

## 2021-09-15 DIAGNOSIS — G4733 Obstructive sleep apnea (adult) (pediatric): Secondary | ICD-10-CM | POA: Diagnosis not present

## 2021-09-15 DIAGNOSIS — J4540 Moderate persistent asthma, uncomplicated: Secondary | ICD-10-CM | POA: Diagnosis not present

## 2021-09-15 DIAGNOSIS — D696 Thrombocytopenia, unspecified: Secondary | ICD-10-CM | POA: Diagnosis not present

## 2021-09-15 DIAGNOSIS — I10 Essential (primary) hypertension: Secondary | ICD-10-CM | POA: Diagnosis not present

## 2021-09-15 DIAGNOSIS — F419 Anxiety disorder, unspecified: Secondary | ICD-10-CM | POA: Diagnosis not present

## 2021-09-15 DIAGNOSIS — Z0001 Encounter for general adult medical examination with abnormal findings: Secondary | ICD-10-CM | POA: Diagnosis not present

## 2021-10-04 DIAGNOSIS — J310 Chronic rhinitis: Secondary | ICD-10-CM | POA: Diagnosis not present

## 2021-10-04 DIAGNOSIS — G4733 Obstructive sleep apnea (adult) (pediatric): Secondary | ICD-10-CM | POA: Diagnosis not present

## 2021-10-04 DIAGNOSIS — Z9989 Dependence on other enabling machines and devices: Secondary | ICD-10-CM | POA: Diagnosis not present

## 2021-10-04 DIAGNOSIS — J449 Chronic obstructive pulmonary disease, unspecified: Secondary | ICD-10-CM | POA: Diagnosis not present

## 2021-10-28 DIAGNOSIS — S61451A Open bite of right hand, initial encounter: Secondary | ICD-10-CM | POA: Diagnosis not present

## 2021-10-28 DIAGNOSIS — W540XXA Bitten by dog, initial encounter: Secondary | ICD-10-CM | POA: Diagnosis not present

## 2022-02-20 DIAGNOSIS — Z87442 Personal history of urinary calculi: Secondary | ICD-10-CM | POA: Diagnosis not present

## 2022-02-20 DIAGNOSIS — R109 Unspecified abdominal pain: Secondary | ICD-10-CM | POA: Diagnosis not present

## 2022-02-20 DIAGNOSIS — M545 Low back pain, unspecified: Secondary | ICD-10-CM | POA: Diagnosis not present

## 2022-02-20 DIAGNOSIS — R1032 Left lower quadrant pain: Secondary | ICD-10-CM | POA: Diagnosis not present

## 2022-03-08 DIAGNOSIS — E119 Type 2 diabetes mellitus without complications: Secondary | ICD-10-CM | POA: Diagnosis not present

## 2022-03-15 DIAGNOSIS — Z0000 Encounter for general adult medical examination without abnormal findings: Secondary | ICD-10-CM | POA: Diagnosis not present

## 2022-03-15 DIAGNOSIS — G4733 Obstructive sleep apnea (adult) (pediatric): Secondary | ICD-10-CM | POA: Diagnosis not present

## 2022-03-15 DIAGNOSIS — J4540 Moderate persistent asthma, uncomplicated: Secondary | ICD-10-CM | POA: Diagnosis not present

## 2022-03-15 DIAGNOSIS — I10 Essential (primary) hypertension: Secondary | ICD-10-CM | POA: Diagnosis not present

## 2022-03-15 DIAGNOSIS — Z125 Encounter for screening for malignant neoplasm of prostate: Secondary | ICD-10-CM | POA: Diagnosis not present

## 2022-03-15 DIAGNOSIS — F419 Anxiety disorder, unspecified: Secondary | ICD-10-CM | POA: Diagnosis not present

## 2022-03-15 DIAGNOSIS — E119 Type 2 diabetes mellitus without complications: Secondary | ICD-10-CM | POA: Diagnosis not present

## 2022-03-15 DIAGNOSIS — F325 Major depressive disorder, single episode, in full remission: Secondary | ICD-10-CM | POA: Diagnosis not present

## 2022-03-15 DIAGNOSIS — G20 Parkinson's disease: Secondary | ICD-10-CM | POA: Diagnosis not present

## 2022-05-04 DIAGNOSIS — G4733 Obstructive sleep apnea (adult) (pediatric): Secondary | ICD-10-CM | POA: Diagnosis not present

## 2022-05-04 DIAGNOSIS — J4540 Moderate persistent asthma, uncomplicated: Secondary | ICD-10-CM | POA: Diagnosis not present

## 2022-05-04 DIAGNOSIS — J310 Chronic rhinitis: Secondary | ICD-10-CM | POA: Diagnosis not present

## 2022-07-10 DIAGNOSIS — R509 Fever, unspecified: Secondary | ICD-10-CM | POA: Diagnosis not present

## 2022-07-10 DIAGNOSIS — R051 Acute cough: Secondary | ICD-10-CM | POA: Diagnosis not present

## 2022-07-10 DIAGNOSIS — Z03818 Encounter for observation for suspected exposure to other biological agents ruled out: Secondary | ICD-10-CM | POA: Diagnosis not present

## 2022-07-10 DIAGNOSIS — J22 Unspecified acute lower respiratory infection: Secondary | ICD-10-CM | POA: Diagnosis not present

## 2022-07-10 DIAGNOSIS — H109 Unspecified conjunctivitis: Secondary | ICD-10-CM | POA: Diagnosis not present

## 2022-07-10 DIAGNOSIS — R059 Cough, unspecified: Secondary | ICD-10-CM | POA: Diagnosis not present

## 2022-07-10 DIAGNOSIS — I10 Essential (primary) hypertension: Secondary | ICD-10-CM | POA: Diagnosis not present

## 2022-07-10 DIAGNOSIS — E119 Type 2 diabetes mellitus without complications: Secondary | ICD-10-CM | POA: Diagnosis not present

## 2022-07-10 DIAGNOSIS — J0110 Acute frontal sinusitis, unspecified: Secondary | ICD-10-CM | POA: Diagnosis not present

## 2022-08-16 DIAGNOSIS — M170 Bilateral primary osteoarthritis of knee: Secondary | ICD-10-CM | POA: Diagnosis not present

## 2022-08-16 DIAGNOSIS — M25561 Pain in right knee: Secondary | ICD-10-CM | POA: Diagnosis not present

## 2022-12-25 DIAGNOSIS — G4733 Obstructive sleep apnea (adult) (pediatric): Secondary | ICD-10-CM | POA: Diagnosis not present

## 2023-01-10 DIAGNOSIS — M170 Bilateral primary osteoarthritis of knee: Secondary | ICD-10-CM | POA: Diagnosis not present

## 2023-01-12 DIAGNOSIS — G4733 Obstructive sleep apnea (adult) (pediatric): Secondary | ICD-10-CM | POA: Diagnosis not present

## 2023-01-17 DIAGNOSIS — M170 Bilateral primary osteoarthritis of knee: Secondary | ICD-10-CM | POA: Diagnosis not present

## 2023-01-23 ENCOUNTER — Other Ambulatory Visit: Payer: Self-pay | Admitting: Orthopedic Surgery

## 2023-01-30 DIAGNOSIS — J4540 Moderate persistent asthma, uncomplicated: Secondary | ICD-10-CM | POA: Diagnosis not present

## 2023-01-30 DIAGNOSIS — E119 Type 2 diabetes mellitus without complications: Secondary | ICD-10-CM | POA: Diagnosis not present

## 2023-01-30 DIAGNOSIS — I10 Essential (primary) hypertension: Secondary | ICD-10-CM | POA: Diagnosis not present

## 2023-01-30 DIAGNOSIS — Z01818 Encounter for other preprocedural examination: Secondary | ICD-10-CM | POA: Diagnosis not present

## 2023-01-31 ENCOUNTER — Other Ambulatory Visit: Payer: Self-pay

## 2023-01-31 ENCOUNTER — Encounter
Admission: RE | Admit: 2023-01-31 | Discharge: 2023-01-31 | Disposition: A | Discharge status: Home / Self Care | Payer: PPO | Source: Ambulatory Visit | Attending: Orthopedic Surgery | Admitting: Orthopedic Surgery

## 2023-01-31 VITALS — BP 153/90 | HR 75 | Temp 98.0°F | Resp 20 | Ht 70.0 in | Wt 228.4 lb

## 2023-01-31 DIAGNOSIS — Z79899 Other long term (current) drug therapy: Secondary | ICD-10-CM | POA: Insufficient documentation

## 2023-01-31 DIAGNOSIS — E1169 Type 2 diabetes mellitus with other specified complication: Secondary | ICD-10-CM | POA: Diagnosis not present

## 2023-01-31 DIAGNOSIS — G4730 Sleep apnea, unspecified: Secondary | ICD-10-CM | POA: Diagnosis not present

## 2023-01-31 DIAGNOSIS — Z01818 Encounter for other preprocedural examination: Secondary | ICD-10-CM | POA: Insufficient documentation

## 2023-01-31 DIAGNOSIS — Z01812 Encounter for preprocedural laboratory examination: Secondary | ICD-10-CM

## 2023-01-31 DIAGNOSIS — R8290 Unspecified abnormal findings in urine: Secondary | ICD-10-CM

## 2023-01-31 HISTORY — DX: Personal history of urinary calculi: Z87.442

## 2023-01-31 HISTORY — DX: Bronchitis, not specified as acute or chronic: J40

## 2023-01-31 HISTORY — DX: Pneumonia, unspecified organism: J18.9

## 2023-01-31 HISTORY — DX: Type 2 diabetes mellitus without complications: E11.9

## 2023-01-31 HISTORY — DX: Diverticulosis of intestine, part unspecified, without perforation or abscess without bleeding: K57.90

## 2023-01-31 LAB — CBC WITH DIFFERENTIAL/PLATELET
Abs Immature Granulocytes: 0.02 10*3/uL (ref 0.00–0.07)
Basophils Absolute: 0 10*3/uL (ref 0.0–0.1)
Basophils Relative: 1 %
Eosinophils Absolute: 0.2 10*3/uL (ref 0.0–0.5)
Eosinophils Relative: 4 %
HCT: 42.3 % (ref 39.0–52.0)
Hemoglobin: 14.3 g/dL (ref 13.0–17.0)
Immature Granulocytes: 1 %
Lymphocytes Relative: 22 %
Lymphs Abs: 0.8 10*3/uL (ref 0.7–4.0)
MCH: 29.6 pg (ref 26.0–34.0)
MCHC: 33.8 g/dL (ref 30.0–36.0)
MCV: 87.6 fL (ref 80.0–100.0)
Monocytes Absolute: 0.5 10*3/uL (ref 0.1–1.0)
Monocytes Relative: 14 %
Neutro Abs: 2.2 10*3/uL (ref 1.7–7.7)
Neutrophils Relative %: 58 %
Platelets: 184 10*3/uL (ref 150–400)
RBC: 4.83 MIL/uL (ref 4.22–5.81)
RDW: 12.7 % (ref 11.5–15.5)
WBC: 3.8 10*3/uL — ABNORMAL LOW (ref 4.0–10.5)
nRBC: 0 % (ref 0.0–0.2)

## 2023-01-31 LAB — TYPE AND SCREEN
ABO/RH(D): A POS
Antibody Screen: NEGATIVE

## 2023-01-31 LAB — URINALYSIS, ROUTINE W REFLEX MICROSCOPIC
Bilirubin Urine: NEGATIVE
Glucose, UA: NEGATIVE mg/dL
Hgb urine dipstick: NEGATIVE
Ketones, ur: NEGATIVE mg/dL
Nitrite: NEGATIVE
Protein, ur: 100 mg/dL — AB
Specific Gravity, Urine: 1.023 (ref 1.005–1.030)
pH: 5 (ref 5.0–8.0)

## 2023-01-31 LAB — COMPREHENSIVE METABOLIC PANEL
ALT: 30 U/L (ref 0–44)
AST: 25 U/L (ref 15–41)
Albumin: 3.9 g/dL (ref 3.5–5.0)
Alkaline Phosphatase: 63 U/L (ref 38–126)
Anion gap: 9 (ref 5–15)
BUN: 20 mg/dL (ref 8–23)
CO2: 25 mmol/L (ref 22–32)
Calcium: 9.5 mg/dL (ref 8.9–10.3)
Chloride: 103 mmol/L (ref 98–111)
Creatinine, Ser: 0.81 mg/dL (ref 0.61–1.24)
GFR, Estimated: >60 mL/min (ref >60)
Glucose, Bld: 147 mg/dL — ABNORMAL HIGH (ref 70–99)
Potassium: 3.4 mmol/L — ABNORMAL LOW (ref 3.5–5.1)
Sodium: 137 mmol/L (ref 135–145)
Total Bilirubin: 0.9 mg/dL (ref 0.3–1.2)
Total Protein: 7.1 g/dL (ref 6.5–8.1)

## 2023-01-31 LAB — URINALYSIS, W/ REFLEX TO CULTURE (INFECTION SUSPECTED)
Bilirubin Urine: NEGATIVE
Glucose, UA: NEGATIVE mg/dL
Hgb urine dipstick: NEGATIVE
Ketones, ur: NEGATIVE mg/dL
Nitrite: NEGATIVE
Protein, ur: 100 mg/dL — AB
Specific Gravity, Urine: 1.023 (ref 1.005–1.030)
Squamous Epithelial / HPF: NONE SEEN /HPF (ref 0–5)
pH: 5 (ref 5.0–8.0)

## 2023-01-31 LAB — SURGICAL PCR SCREEN
MRSA, PCR: NEGATIVE
Staphylococcus aureus: NEGATIVE

## 2023-01-31 NOTE — Patient Instructions (Addendum)
Your procedure is scheduled on: 02/12/2023  Report to the Registration Desk on the 1st floor of the [facility]. To find out your arrival time, please call [phone] between 1PM - 3PM on: 02/09/2023  If your arrival time is 6:00 am, do not arrive before that time as the [facility] entrance doors do not open until 6:00 am.  REMEMBER: Instructions that are not followed completely may result in serious medical risk, up to and including death; or upon the discretion of your surgeon and anesthesiologist your surgery may need to be rescheduled.  Do not eat food after midnight the night before surgery.  No gum chewing or hard candies.  You may however, drink CLEAR liquids up to 2 hours before you are scheduled to arrive for your surgery. Do not drink anything within 2 hours of your scheduled arrival time.  Clear liquids include: - water   In addition, your doctor has ordered for you to drink the provided:   Gatorade G2 Drinking this carbohydrate drink up to two hours before surgery helps to reduce insulin resistance and improve patient outcomes. Please complete drinking 2 hours before scheduled arrival time.   Hold you metformin for two days prior to surgery. Last dose is 02/09/2023  You must hold your Ozempic 7 days prior to surgery. Latest dose you can have is 02/04/2023  One week prior to surgery: Stop Anti-inflammatories (NSAIDS) such as Advil, Aleve, Ibuprofen, Motrin, Naproxen, Naprosyn and Aspirin based products such as Excedrin, Goody's Powder, BC Powder and meloxicam Stop ANY OVER THE COUNTER supplements until after surgery like iron  You may however, continue to take Tylenol if needed for pain up until the day of surgery.  Use albuterol inhaler on the day of surgery as prescribed or bring to the hospital.   No Alcohol for 24 hours before or after surgery.  No Smoking including e-cigarettes for 24 hours before surgery.  No chewable tobacco products for at least 6 hours  before surgery.  No nicotine patches on the day of surgery.  Do not use any "recreational" drugs for at least a week (preferably 2 weeks) before your surgery.  Please be advised that the combination of cocaine and anesthesia may have negative outcomes, up to and including death. If you test positive for cocaine, your surgery will be cancelled.  On the morning of surgery brush your teeth with toothpaste and water, you may rinse your mouth with mouthwash if you wish. Do not swallow any toothpaste or mouthwash.  Use CHG Soap or wipes as directed on instruction sheet.  Do not wear jewelry, make-up, hairpins, clips or nail polish.  Do not wear lotions, powders, or perfumes.   Do not shave body hair from the neck down 48 hours before surgery.  Contact lenses, hearing aids and dentures may not be worn into surgery.  Do not bring valuables to the hospital. [facility] is not responsible for any missing/lost belongings or valuables.   Bring your C-PAP to the hospital in case you may have to spend the night.   Notify your doctor if there is any change in your medical condition (cold, fever, infection).  Wear comfortable clothing (specific to your surgery type) to the hospital.  After surgery, you can help prevent lung complications by doing breathing exercises.  Take deep breaths and cough every 1-2 hours. Your doctor may order a device called an Incentive Spirometer to help you take deep breaths. If you are being admitted to the hospital overnight, leave  your suitcase in the car. After surgery it may be brought to your room.  In case of increased patient census, it may be necessary for you, the patient, to continue your postoperative care in the Same Day Surgery department.  Please call the [facility] Dept. at [phone] if you have any questions about these instructions.  Surgery Visitation Policy:  Patients undergoing a surgery or procedure may have two family members  or support persons with them as long as the person is not COVID-19 positive or experiencing its symptoms.   Inpatient Visitation:    Visiting hours are 7 a.m. to 8 p.m. Up to four visitors are allowed at one time in a patient room. The visitors may rotate out with other people during the day. One designated support person (adult) may remain overnight.  Due to an increase in RSV and influenza rates and associated hospitalizations, children ages 79 and under will not be able to visit patients in [facility]. Masks continue to be strongly recommended.

## 2023-02-01 LAB — URINE CULTURE: Culture: NO GROWTH

## 2023-02-02 NOTE — Progress Notes (Signed)
  Perioperative Services Pre-Admission/Anesthesia Testing    Date: 02/02/23  Name: Alexander Rogers MRN:   [id number]  Re: GLP-1 clearance and provider recommendations   Planned Surgical Procedure(s):    Case: [id number] Date/Time: 02/12/23 1014   Procedure: TOTAL KNEE ARTHROPLASTY (Left: Knee)   Anesthesia type: Choice   Pre-op diagnosis: Primary localized osteoarthritis of both knees M17.0   Location: [facility] OR ROOM 02 / [place] [facility]   Surgeons: Steffanie Rainwater, MD   Clinical Notes:  Patient is scheduled for the above procedure with the indicated provider/surgeon. In review of his medication reconciliation it was noted that patient is on a prescribed GLP-1 medication. Per guidelines issued by the American Society of Anesthesiologists (ASA), it is recommended that these medications be held for 7 days prior to the patient undergoing any type of elective surgical procedure. The patient is taking the following GLP-1 medication:  [x]  SEMAGLUTIDE   []  EXENATIDE  []  LIRAGLUTIDE   []  LIXISENATIDE  []  DULAGLUTIDE     []  OTHER GLP-1 medication: _______________  Reached out to prescribing provider Wynetta Emery, MD) to make them aware of the guidelines from anesthesia. Given that this patient takes the prescribed GLP-1 medication for his  diabetes diagnosis, rather than for weight loss, recommendations from the prescribing provider were solicited. Prescribing provider made aware of the following so that informed decision/POC can be developed for this patient that may be taking medications belonging to these drug classes:  Oral GLP-1 medications will be held 1 day prior to surgery.  Injectable GLP-1 medications will be held 7 days prior to surgery.  Metformin is routinely held 48 hours prior to surgery due to renal concerns, potential need for contrasted imaging perioperatively, and the potential for tissue hypoxia leading to drug induced lactic acidosis.  All SGLT2i medications  are held 72 hours prior to surgery as they can be associated with the increased potential for developing euglycemic diabetic ketoacidosis (EDKA).   Impression and Plan:  [name] is on a prescribed GLP-1 medication, which induces the known side effect of decreased gastric emptying. Efforts are bring made to mitigate the risk of perioperative hyperglycemic events, as elevated blood glucose levels have been found to contribute to intra/postoperative complications. Additionally, hyperglycemic extremes can potentially necessitate the postponing of a patient's elective case in order to better optimize perioperative glycemic control, again with the aforementioned guidelines in place. With this in mind, recommendations have been sought from the prescribing provider, who has cleared patient to proceed with holding the prescribed GLP-1 as per the guidelines from the ASA.   Provider recommending: no further recommendations received from the prescribing provider.  Copy of signed clearance and recommendations placed on patient's chart for inclusion in their medical record and for review by the surgical/anesthetic team on the day of his procedure.   Honor Loh, MSN, APRN, FNP-C, CEN [facility]  Peri-operative Services Nurse Practitioner Phone: [phone] 02/02/23 1:41 PM  NOTE: This note has been prepared using Dragon dictation software. Despite my best ability to proofread, there is always the potential that unintentional transcriptional errors may still occur from this process.

## 2023-02-11 MED ORDER — TRANEXAMIC ACID-NACL 1000-0.7 MG/100ML-% IV SOLN
1000.0000 mg | INTRAVENOUS | Status: AC
  Administered 2023-02-12 (×2): 1000 mg via INTRAVENOUS

## 2023-02-11 MED ORDER — FAMOTIDINE 20 MG PO TABS: 20.0000 mg | ORAL_TABLET | Freq: Once | ORAL | Status: AC

## 2023-02-11 MED ORDER — DEXAMETHASONE SODIUM PHOSPHATE 10 MG/ML IJ SOLN
8.0000 mg | Freq: Once | INTRAMUSCULAR | Status: AC
  Administered 2023-02-12: 8 mg via INTRAVENOUS

## 2023-02-11 MED ORDER — CHLORHEXIDINE GLUCONATE 0.12 % MT SOLN: 15.0000 mL | Freq: Once | OROMUCOSAL | Status: AC

## 2023-02-11 MED ORDER — ORAL CARE MOUTH RINSE: 15.0000 mL | Freq: Once | OROMUCOSAL | Status: AC

## 2023-02-11 MED ORDER — CEFAZOLIN SODIUM-DEXTROSE 2-4 GM/100ML-% IV SOLN
2.0000 g | INTRAVENOUS | Status: AC
  Administered 2023-02-12: 2 g via INTRAVENOUS

## 2023-02-11 MED ORDER — SODIUM CHLORIDE 0.9 % IV SOLN: INTRAVENOUS | Status: DC

## 2023-02-12 ENCOUNTER — Encounter: Payer: Self-pay | Admitting: Orthopedic Surgery

## 2023-02-12 ENCOUNTER — Ambulatory Visit: Payer: PPO | Admitting: Urgent Care

## 2023-02-12 ENCOUNTER — Other Ambulatory Visit: Payer: Self-pay

## 2023-02-12 ENCOUNTER — Encounter
Admission: RE | Disposition: A | Discharge status: Home Health | Payer: Self-pay | Source: Home / Self Care | Attending: Orthopedic Surgery

## 2023-02-12 ENCOUNTER — Ambulatory Visit: Payer: PPO

## 2023-02-12 ENCOUNTER — Observation Stay
Admission: RE | Admit: 2023-02-12 | Discharge: 2023-02-13 | Disposition: A | Discharge status: Home Health | Payer: PPO | Attending: Orthopedic Surgery | Admitting: Orthopedic Surgery

## 2023-02-12 DIAGNOSIS — E119 Type 2 diabetes mellitus without complications: Secondary | ICD-10-CM | POA: Diagnosis not present

## 2023-02-12 DIAGNOSIS — I10 Essential (primary) hypertension: Secondary | ICD-10-CM | POA: Insufficient documentation

## 2023-02-12 DIAGNOSIS — E1169 Type 2 diabetes mellitus with other specified complication: Secondary | ICD-10-CM

## 2023-02-12 DIAGNOSIS — M1712 Unilateral primary osteoarthritis, left knee: Principal | ICD-10-CM | POA: Insufficient documentation

## 2023-02-12 DIAGNOSIS — M170 Bilateral primary osteoarthritis of knee: Secondary | ICD-10-CM | POA: Diagnosis not present

## 2023-02-12 DIAGNOSIS — Z471 Aftercare following joint replacement surgery: Secondary | ICD-10-CM | POA: Diagnosis not present

## 2023-02-12 DIAGNOSIS — Z7984 Long term (current) use of oral hypoglycemic drugs: Secondary | ICD-10-CM | POA: Diagnosis not present

## 2023-02-12 DIAGNOSIS — J45909 Unspecified asthma, uncomplicated: Secondary | ICD-10-CM | POA: Insufficient documentation

## 2023-02-12 DIAGNOSIS — Z96652 Presence of left artificial knee joint: Secondary | ICD-10-CM | POA: Diagnosis not present

## 2023-02-12 DIAGNOSIS — G4733 Obstructive sleep apnea (adult) (pediatric): Secondary | ICD-10-CM | POA: Diagnosis not present

## 2023-02-12 DIAGNOSIS — Z79899 Other long term (current) drug therapy: Secondary | ICD-10-CM | POA: Insufficient documentation

## 2023-02-12 HISTORY — PX: TOTAL KNEE ARTHROPLASTY: SHX125

## 2023-02-12 LAB — GLUCOSE, CAPILLARY
Glucose-Capillary: 182 mg/dL — ABNORMAL HIGH (ref 70–99)
Glucose-Capillary: 183 mg/dL — ABNORMAL HIGH (ref 70–99)
Glucose-Capillary: 331 mg/dL — ABNORMAL HIGH (ref 70–99)

## 2023-02-12 LAB — ABO/RH: ABO/RH(D): A POS

## 2023-02-12 SURGERY — ARTHROPLASTY, KNEE, TOTAL
Anesthesia: Spinal | Site: Knee | Laterality: Left

## 2023-02-12 MED ORDER — SODIUM CHLORIDE (PF) 0.9 % IJ SOLN
INTRAMUSCULAR | Status: DC | PRN
  Administered 2023-02-12: 71 mL via INTRAMUSCULAR

## 2023-02-12 MED ORDER — ACETAMINOPHEN 500 MG PO TABS
1000.0000 mg | ORAL_TABLET | Freq: Three times a day (TID) | ORAL | Status: DC
  Administered 2023-02-12 – 2023-02-13 (×2): 1000 mg via ORAL
  Filled 2023-02-12 (×2): qty 2

## 2023-02-12 MED ORDER — TRAMADOL HCL 50 MG PO TABS
50.0000 mg | ORAL_TABLET | Freq: Four times a day (QID) | ORAL | Status: DC | PRN
  Administered 2023-02-13: 50 mg via ORAL
  Filled 2023-02-12: qty 1

## 2023-02-12 MED ORDER — PANTOPRAZOLE SODIUM 40 MG PO TBEC
40.0000 mg | DELAYED_RELEASE_TABLET | Freq: Every day | ORAL | Status: DC
  Administered 2023-02-12 – 2023-02-13 (×2): 40 mg via ORAL
  Filled 2023-02-12 (×2): qty 1

## 2023-02-12 MED ORDER — MIDAZOLAM HCL 2 MG/2ML IJ SOLN
INTRAMUSCULAR | Status: AC
  Filled 2023-02-12: qty 2

## 2023-02-12 MED ORDER — ENOXAPARIN SODIUM 30 MG/0.3ML IJ SOSY
30.0000 mg | PREFILLED_SYRINGE | Freq: Two times a day (BID) | INTRAMUSCULAR | Status: DC
  Administered 2023-02-13: 30 mg via SUBCUTANEOUS
  Filled 2023-02-12: qty 0.3

## 2023-02-12 MED ORDER — ONDANSETRON HCL 4 MG PO TABS: 4.0000 mg | ORAL_TABLET | Freq: Four times a day (QID) | ORAL | Status: DC | PRN

## 2023-02-12 MED ORDER — KETAMINE HCL 50 MG/5ML IJ SOSY
PREFILLED_SYRINGE | INTRAMUSCULAR | Status: AC
  Filled 2023-02-12: qty 5

## 2023-02-12 MED ORDER — EPHEDRINE SULFATE (PRESSORS) 50 MG/ML IJ SOLN
INTRAMUSCULAR | Status: DC | PRN
  Administered 2023-02-12 (×2): 5 mg via INTRAVENOUS

## 2023-02-12 MED ORDER — KETOROLAC TROMETHAMINE 15 MG/ML IJ SOLN
INTRAMUSCULAR | Status: AC
  Filled 2023-02-12: qty 1

## 2023-02-12 MED ORDER — HYDROCODONE-ACETAMINOPHEN 5-325 MG PO TABS: 1.0000 | ORAL_TABLET | ORAL | Status: DC | PRN

## 2023-02-12 MED ORDER — OXYCODONE HCL 5 MG PO TABS: 5.0000 mg | ORAL_TABLET | Freq: Once | ORAL | Status: AC | PRN

## 2023-02-12 MED ORDER — PRAMIPEXOLE DIHYDROCHLORIDE 0.25 MG PO TABS
0.2500 mg | ORAL_TABLET | Freq: Every day | ORAL | Status: DC
  Administered 2023-02-13: 0.25 mg via ORAL
  Filled 2023-02-12: qty 1

## 2023-02-12 MED ORDER — AMLODIPINE BESYLATE 5 MG PO TABS
ORAL_TABLET | ORAL | Status: AC
  Filled 2023-02-12: qty 2

## 2023-02-12 MED ORDER — PROPOFOL 10 MG/ML IV BOLUS
INTRAVENOUS | Status: AC
  Filled 2023-02-12: qty 20

## 2023-02-12 MED ORDER — MIDAZOLAM HCL 5 MG/5ML IJ SOLN
INTRAMUSCULAR | Status: DC | PRN
  Administered 2023-02-12 (×2): 1 mg via INTRAVENOUS

## 2023-02-12 MED ORDER — METOCLOPRAMIDE HCL 5 MG/ML IJ SOLN: 5.0000 mg | Freq: Three times a day (TID) | INTRAMUSCULAR | Status: DC | PRN

## 2023-02-12 MED ORDER — INSULIN ASPART 100 UNIT/ML IJ SOLN
0.0000 [IU] | Freq: Three times a day (TID) | INTRAMUSCULAR | Status: DC
  Administered 2023-02-13: 3 [IU] via SUBCUTANEOUS
  Administered 2023-02-13: 8 [IU] via SUBCUTANEOUS
  Filled 2023-02-12 (×2): qty 1

## 2023-02-12 MED ORDER — KETOROLAC TROMETHAMINE 30 MG/ML IJ SOLN
INTRAMUSCULAR | Status: AC
  Filled 2023-02-12: qty 1

## 2023-02-12 MED ORDER — CEFAZOLIN SODIUM-DEXTROSE 2-4 GM/100ML-% IV SOLN
2.0000 g | Freq: Four times a day (QID) | INTRAVENOUS | Status: AC
  Administered 2023-02-12: 2 g via INTRAVENOUS
  Filled 2023-02-12 (×3): qty 100

## 2023-02-12 MED ORDER — FAMOTIDINE 20 MG PO TABS
ORAL_TABLET | ORAL | Status: AC
  Administered 2023-02-12: 20 mg via ORAL
  Filled 2023-02-12: qty 1

## 2023-02-12 MED ORDER — CHLORHEXIDINE GLUCONATE 0.12 % MT SOLN
OROMUCOSAL | Status: AC
  Administered 2023-02-12: 15 mL via OROMUCOSAL
  Filled 2023-02-12: qty 15

## 2023-02-12 MED ORDER — TRANEXAMIC ACID-NACL 1000-0.7 MG/100ML-% IV SOLN
INTRAVENOUS | Status: AC
  Filled 2023-02-12: qty 100

## 2023-02-12 MED ORDER — BUPIVACAINE HCL (PF) 0.5 % IJ SOLN
INTRAMUSCULAR | Status: DC | PRN
  Administered 2023-02-12: 2.5 mL via INTRATHECAL

## 2023-02-12 MED ORDER — KETOROLAC TROMETHAMINE 15 MG/ML IJ SOLN
7.5000 mg | Freq: Four times a day (QID) | INTRAMUSCULAR | Status: AC
  Administered 2023-02-12 – 2023-02-13 (×4): 7.5 mg via INTRAVENOUS
  Filled 2023-02-12 (×3): qty 1

## 2023-02-12 MED ORDER — ONDANSETRON HCL 4 MG/2ML IJ SOLN: 4.0000 mg | Freq: Four times a day (QID) | INTRAMUSCULAR | Status: DC | PRN

## 2023-02-12 MED ORDER — TRANEXAMIC ACID 1000 MG/10ML IV SOLN
INTRAVENOUS | Status: AC
  Filled 2023-02-12: qty 10

## 2023-02-12 MED ORDER — PHENYLEPHRINE HCL-NACL 20-0.9 MG/250ML-% IV SOLN
INTRAVENOUS | Status: DC | PRN
  Administered 2023-02-12: 25 ug/min via INTRAVENOUS

## 2023-02-12 MED ORDER — FENTANYL CITRATE (PF) 100 MCG/2ML IJ SOLN: 25.0000 ug | INTRAMUSCULAR | Status: DC | PRN

## 2023-02-12 MED ORDER — AMLODIPINE BESYLATE 10 MG PO TABS
10.0000 mg | ORAL_TABLET | Freq: Every day | ORAL | Status: DC
  Administered 2023-02-12 – 2023-02-13 (×2): 10 mg via ORAL
  Filled 2023-02-12: qty 1

## 2023-02-12 MED ORDER — SODIUM CHLORIDE 0.9 % IR SOLN
Status: DC | PRN
  Administered 2023-02-12: 3000 mL

## 2023-02-12 MED ORDER — 0.9 % SODIUM CHLORIDE (POUR BTL) OPTIME
TOPICAL | Status: DC | PRN
  Administered 2023-02-12: 500 mL

## 2023-02-12 MED ORDER — ACETAMINOPHEN 500 MG PO TABS
ORAL_TABLET | ORAL | Status: AC
  Administered 2023-02-12: 1000 mg via ORAL
  Filled 2023-02-12: qty 2

## 2023-02-12 MED ORDER — DOCUSATE SODIUM 100 MG PO CAPS
100.0000 mg | ORAL_CAPSULE | Freq: Two times a day (BID) | ORAL | Status: DC
  Administered 2023-02-12 – 2023-02-13 (×2): 100 mg via ORAL
  Filled 2023-02-12 (×2): qty 1

## 2023-02-12 MED ORDER — MENTHOL 3 MG MT LOZG: 1.0000 | LOZENGE | OROMUCOSAL | Status: DC | PRN

## 2023-02-12 MED ORDER — KETAMINE HCL 10 MG/ML IJ SOLN
INTRAMUSCULAR | Status: DC | PRN
  Administered 2023-02-12: 20 mg via INTRAVENOUS
  Administered 2023-02-12: 30 mg via INTRAVENOUS

## 2023-02-12 MED ORDER — PHENOL 1.4 % MT LIQD: 1.0000 | OROMUCOSAL | Status: DC | PRN

## 2023-02-12 MED ORDER — STERILE WATER FOR IRRIGATION IR SOLN
Status: DC | PRN
  Administered 2023-02-12: 1000 mL

## 2023-02-12 MED ORDER — IRRISEPT - 450ML BOTTLE WITH 0.05% CHG IN STERILE WATER, USP 99.95% OPTIME
TOPICAL | Status: DC | PRN
  Administered 2023-02-12: 450 mL

## 2023-02-12 MED ORDER — CEFAZOLIN SODIUM-DEXTROSE 2-4 GM/100ML-% IV SOLN
INTRAVENOUS | Status: AC
  Filled 2023-02-12: qty 100

## 2023-02-12 MED ORDER — BUPIVACAINE HCL (PF) 0.5 % IJ SOLN
INTRAMUSCULAR | Status: AC
  Filled 2023-02-12: qty 10

## 2023-02-12 MED ORDER — ESCITALOPRAM OXALATE 10 MG PO TABS
20.0000 mg | ORAL_TABLET | Freq: Every day | ORAL | Status: DC
  Administered 2023-02-13: 20 mg via ORAL
  Filled 2023-02-12: qty 2

## 2023-02-12 MED ORDER — OXYCODONE HCL 5 MG/5ML PO SOLN: 5.0000 mg | Freq: Once | ORAL | Status: AC | PRN

## 2023-02-12 MED ORDER — PHENYLEPHRINE HCL-NACL 20-0.9 MG/250ML-% IV SOLN
INTRAVENOUS | Status: AC
  Filled 2023-02-12: qty 250

## 2023-02-12 MED ORDER — PROPOFOL 1000 MG/100ML IV EMUL
INTRAVENOUS | Status: AC
  Filled 2023-02-12: qty 100

## 2023-02-12 MED ORDER — MORPHINE SULFATE (PF) 2 MG/ML IV SOLN: 0.5000 mg | INTRAVENOUS | Status: DC | PRN

## 2023-02-12 MED ORDER — CEFAZOLIN SODIUM-DEXTROSE 2-4 GM/100ML-% IV SOLN
INTRAVENOUS | Status: AC
  Administered 2023-02-12: 2 g via INTRAVENOUS
  Filled 2023-02-12: qty 100

## 2023-02-12 MED ORDER — SODIUM CHLORIDE 0.9 % IV SOLN: INTRAVENOUS | Status: DC

## 2023-02-12 MED ORDER — PROPOFOL 500 MG/50ML IV EMUL
INTRAVENOUS | Status: DC | PRN
  Administered 2023-02-12: 100 ug/kg/min via INTRAVENOUS

## 2023-02-12 MED ORDER — INSULIN ASPART 100 UNIT/ML IJ SOLN
0.0000 [IU] | Freq: Every day | INTRAMUSCULAR | Status: DC
  Administered 2023-02-12: 4 [IU] via SUBCUTANEOUS
  Filled 2023-02-12: qty 1

## 2023-02-12 MED ORDER — METOCLOPRAMIDE HCL 5 MG PO TABS: 5.0000 mg | ORAL_TABLET | Freq: Three times a day (TID) | ORAL | Status: DC | PRN

## 2023-02-12 MED ORDER — DEXAMETHASONE SODIUM PHOSPHATE 10 MG/ML IJ SOLN
INTRAMUSCULAR | Status: AC
  Filled 2023-02-12: qty 1

## 2023-02-12 MED ORDER — OXYCODONE HCL 5 MG PO TABS
ORAL_TABLET | ORAL | Status: AC
  Administered 2023-02-12: 5 mg via ORAL
  Filled 2023-02-12: qty 1

## 2023-02-12 SURGICAL SUPPLY — 79 items
BLADE PATELLA W-PILOT HOLE 35 (BLADE) IMPLANT
BLADE SAW SAG 25X90X1.19 (BLADE) ×1 IMPLANT
BLADE SAW SAG 29X58X.64 (BLADE) ×1 IMPLANT
BNDG ELASTIC 6X5.8 VLCR STR LF (GAUZE/BANDAGES/DRESSINGS) ×1 IMPLANT
BOWL CEMENT MIX W/ADAPTER (MISCELLANEOUS) ×1 IMPLANT
CEMENT BONE R 1X40 (Cement) ×2 IMPLANT
CHLORAPREP W/TINT 26 (MISCELLANEOUS) ×2 IMPLANT
COMP FEM CEMT PERSONA STD SZ7 (Knees) ×1 IMPLANT
COMPONENT FEM CMT PRSN STD SZ7 (Knees) IMPLANT
COOLER POLAR GLACIER W/PUMP (MISCELLANEOUS) ×1 IMPLANT
CUFF TOURN SGL QUICK 24 (TOURNIQUET CUFF)
CUFF TOURN SGL QUICK 34 (TOURNIQUET CUFF)
CUFF TRNQT CYL 24X4X16.5-23 (TOURNIQUET CUFF) IMPLANT
CUFF TRNQT CYL 34X4.125X (TOURNIQUET CUFF) IMPLANT
DERMABOND ADVANCED .7 DNX12 (GAUZE/BANDAGES/DRESSINGS) ×1 IMPLANT
DRAPE 3/4 80X56 (DRAPES) ×1 IMPLANT
DRAPE INCISE IOBAN 66X60 STRL (DRAPES) ×1 IMPLANT
DRSG MEPILEX SACRM 8.7X9.8 (GAUZE/BANDAGES/DRESSINGS) ×1 IMPLANT
DRSG OPSITE POSTOP 4X10 (GAUZE/BANDAGES/DRESSINGS) IMPLANT
DRSG OPSITE POSTOP 4X8 (GAUZE/BANDAGES/DRESSINGS) IMPLANT
ELECT CAUTERY BLADE 6.4 (BLADE) ×1 IMPLANT
ELECT REM PT RETURN 9FT ADLT (ELECTROSURGICAL) ×1
ELECTRODE REM PT RTRN 9FT ADLT (ELECTROSURGICAL) ×1 IMPLANT
GLOVE BIO SURGEON STRL SZ8 (GLOVE) ×1 IMPLANT
GLOVE BIOGEL PI IND STRL 8 (GLOVE) ×1 IMPLANT
GLOVE PI ORTHO PRO STRL 7.5 (GLOVE) ×2 IMPLANT
GLOVE PI ORTHO PRO STRL SZ8 (GLOVE) ×2 IMPLANT
GOWN STRL REUS W/ TWL LRG LVL3 (GOWN DISPOSABLE) ×1 IMPLANT
GOWN STRL REUS W/ TWL XL LVL3 (GOWN DISPOSABLE) ×2 IMPLANT
GOWN STRL REUS W/TWL LRG LVL3 (GOWN DISPOSABLE) ×1
GOWN STRL REUS W/TWL XL LVL3 (GOWN DISPOSABLE) ×2
HANDLE YANKAUER SUCT OPEN TIP (MISCELLANEOUS) ×1 IMPLANT
HDLS TROCR DRIL PIN KNEE 75 (PIN) ×1
HOLDER FOLEY CATH W/STRAP (MISCELLANEOUS) ×1 IMPLANT
HOOD PEEL AWAY T7 (MISCELLANEOUS) ×3 IMPLANT
INSERT FIXED AS SZ 6-7 13 LT (Insert) IMPLANT
IV NS IRRIG 3000ML ARTHROMATIC (IV SOLUTION) ×1 IMPLANT
JET LAVAGE IRRISEPT WOUND (IRRIGATION / IRRIGATOR) ×1
KIT TURNOVER KIT A (KITS) ×1 IMPLANT
LAVAGE JET IRRISEPT WOUND (IRRIGATION / IRRIGATOR) IMPLANT
MANIFOLD NEPTUNE II (INSTRUMENTS) ×1 IMPLANT
MARKER SKIN DUAL TIP RULER LAB (MISCELLANEOUS) ×1 IMPLANT
MAT ABSORB  FLUID 56X50 GRAY (MISCELLANEOUS) ×1
MAT ABSORB FLUID 56X50 GRAY (MISCELLANEOUS) ×1 IMPLANT
NDL HYPO 21X1.5 SAFETY (NEEDLE) ×1 IMPLANT
NDL SAFETY ECLIP 18X1.5 (MISCELLANEOUS) ×1 IMPLANT
NEEDLE HYPO 21X1.5 SAFETY (NEEDLE) ×1 IMPLANT
NS IRRIG 500ML POUR BTL (IV SOLUTION) ×1 IMPLANT
PACK TOTAL KNEE (MISCELLANEOUS) ×1 IMPLANT
PAD WRAPON POLAR KNEE (MISCELLANEOUS) ×1 IMPLANT
PENCIL SMOKE EVACUATOR (MISCELLANEOUS) ×1 IMPLANT
PIN DRILL HDLS TROCAR 75 4PK (PIN) IMPLANT
PULSAVAC PLUS IRRIG FAN TIP (DISPOSABLE) ×1
SCREW FEMALE HEX FIX 25X2.5 (ORTHOPEDIC DISPOSABLE SUPPLIES) IMPLANT
SCREW HEX HEADED 3.5X27 DISP (ORTHOPEDIC DISPOSABLE SUPPLIES) IMPLANT
SLEEVE SCD COMPRESS KNEE MED (STOCKING) ×1 IMPLANT
SOLUTION IRRIG SURGIPHOR (IV SOLUTION) ×1 IMPLANT
STEM POLY PAT PLY 32M KNEE (Knees) IMPLANT
STEM TIB ST PERS 14+30 (Stem) IMPLANT
STEM TIBIA 5 DEG SZ F L KNEE (Knees) IMPLANT
SUT DVC 2 QUILL PDO  T11 36X36 (SUTURE) ×1
SUT DVC 2 QUILL PDO T11 36X36 (SUTURE) ×1 IMPLANT
SUT QUILL MONODERM 3-0 PS-2 (SUTURE) ×1 IMPLANT
SUT VIC AB 0 CT1 36 (SUTURE) ×1 IMPLANT
SUT VIC AB 2-0 CT2 27 (SUTURE) ×2 IMPLANT
SUT VICRYL 1-0 27IN ABS (SUTURE) ×1
SUTURE VICRYL 1-0 27IN ABS (SUTURE) ×1 IMPLANT
SYR 10ML LL (SYRINGE) ×1 IMPLANT
SYR 30ML LL (SYRINGE) ×2 IMPLANT
SYR 3ML LL SCALE MARK (SYRINGE) ×1 IMPLANT
TAPE CLOTH 3X10 WHT NS LF (GAUZE/BANDAGES/DRESSINGS) ×1 IMPLANT
TIBIA STEM 5 DEG SZ F L KNEE (Knees) ×1 IMPLANT
TIP FAN IRRIG PULSAVAC PLUS (DISPOSABLE) ×1 IMPLANT
TOWEL OR 17X26 4PK STRL BLUE (TOWEL DISPOSABLE) IMPLANT
TRAP FLUID SMOKE EVACUATOR (MISCELLANEOUS) ×1 IMPLANT
TRAY FOLEY SLVR 16FR LF STAT (SET/KITS/TRAYS/PACK) ×1 IMPLANT
TUBE KAMVAC SUCTION (TUBING) IMPLANT
WATER STERILE IRR 1000ML POUR (IV SOLUTION) ×1 IMPLANT
WRAPON POLAR PAD KNEE (MISCELLANEOUS) ×1

## 2023-02-12 NOTE — Op Note (Signed)
Patient Name: Jacobs Tise  [id number]  Pre-Operative Diagnosis: Left knee Osteoarthritis  Post-Operative Diagnosis: (same)  Procedure: Left Total Knee Arthroplasty  Components/Implants: Femur: Persona Size 7 CR Std   Tibia: Persona Size F w/ 14x20m stem extension Poly: 134mMC  Patella: 32x8.10m61mymmetric cemented  Femoral Valgus Cut Angle: 5 degrees  Distal Femoral Re-cut: none  Patella Resurfacing: Yes   Date of Surgery: 02/12/2023  Surgeon: ZacSteffanie Rainwater  Assistant: ThoDorise Hissresent and scrubbed throughout the case, critical for assistance with exposure, retraction, instrumentation, and closure)  Anesthesiologist: WooBarbra Sarksnesthesia: Spinal   Tourniquet Time: 62 min  EBL: 30cc  IVF: [id number]: None   Brief history: The patient is a 67 71ar old male with a history of osteoarthritis of the left knee with pain limiting their range of motion and activities of daily living, which has failed multiple attempts at conservative therapy.  The risks and benefits of total knee arthroplasty as definitive surgical treatment were discussed with the patient, who opted to proceed with the operation.  After outpatient medical clearance and optimization was completed [facility]  All preoperative films were reviewed and an appropriate surgical plan was made prior to surgery. Preoperative range of motion was 0 to 120 with no flexion contracture. The patient was identified as having a varus alignment.   Description of procedure: The patient was brought to the operating room where laterality was confirmed by all those present to be the left side.   Spinal anesthesia was administered and the patient received an intravenous dose of antibiotics for surgical prophylaxis and a dose of tranexamic acid.  Patient is positioned supine on the operating room table with all bony prominences well-padded.  A  well-padded tourniquet was applied to the left thigh.  The knee was then prepped and draped in usual sterile fashion with multiple layers of adhesive and nonadhesive drapes.  All of those present in the operating room participated in a surgical timeout laterality and patient were confirmed.   An Esmarch was wrapped around the extremity and the leg was elevated and the knee flexed.  The tourniquet was inflated to a pressure of 275 mmHg. The Esmarch was removed and the leg was brought down to full extension.  The patella and tibial tubercle identified and outlined using a marking pen and a midline skin incision was made with a knife carried through the subcutaneous tissue down to the extensor retinaculum.  After exposure of the extensor mechanism the medial parapatellar arthrotomy was performed with a scalpel and electrocautery extending down medial and distal to the tibial tubercle taking care to avoid incising the patellar tendon.   A standard medial release was performed over the proximal tibia.  The knee was brought into extension in order to excise the fat pad taking care not to damage the patella tendon.  The superior soft tissue was removed from the anterior surface of the distal femur to visualize for the procedure.  The knee was then brought into flexion with the patella subluxed laterally and subluxing the tibia anteriorly.  The ACL was transected and removed with electrocautery and additional soft tissue was removed from the proximal surface of the tibia to fully expose. The PCL was found to be intact and was preserved.  An extramedullary tibial cutting guide was then applied to the leg with a spring-loaded ankle clamp placed around the distal tibia just above the malleoli the angulation of the guide  was adjusted to give some posterior slope in the tibial resection with an appropriate varus/valgus alignment.  The resection guide was then pinned to the proximal tibia and the proximal tibial surface was  resected with an oscillating saw.  Careful attention was paid to ensure the blade did not disrupt any of the soft tissues including any lateral or medial ligament.  Attention was then turned to the femur, with the knee slightly flexed a opening drill was used to enter the medullary canal of the femur.  After removing the drill marrow was suctioned out to decompress the distal femur.  An intramedullary femoral guide was then inserted into the drill hole and the alignment guide was seated firmly against the distal end of the medial femoral condyle.  The distal femoral cutting guide was then attached and pinned securely to the anterior surface of the femur and the intramedullary rod and alignment guide was removed.  Distal femur resection was then performed with an oscillating saw with retractors protecting medial and laterally.   The distal cutting block was then removed and the extension gap was checked with a spacer.  Extension gap was found to be appropriately sized to accommodate the spacer block.   The femoral sizing guide was then placed securely into the posterior condyles of the femur and the femoral size was measured and determined to be 7.  The size 7; 4-in-1 cutting guide was placed in position and secured with 2 pins.  The anterior posterior and chamfer resections were then performed with an oscillating saw.  Bony fragments and osteophytes were then removed.  Using a lamina spreader the posterior medial and lateral condyles were checked for additional osteophytes and posterior soft tissue remnants.  Any remaining meniscus was removed at this time.  Periarticular injection was performed in the meniscal rims and posterior capsule with aspiration performed to ensure no intravascular injection.   The tibia was then exposed and the tibial trial was pinned onto the plateau after confirming appropriate orientation and rotation.  Using the drill bushing the tibia was prepared to the appropriate drill  depth.  Tibial broach impactor was then driven through the punch guide using a mallet.  The femoral trial component was then inserted onto the femur.  A trial tibial polyethylene bearing was then placed and the knee was reduced.  The knee achieved full extension with no hyperextension and was found to be balanced in flexion and extension with the trials in place.  The knee was then brought into full extension the patella was everted and held with 2 Kocher clamps.  The articular surface of the patella was then resected with an patella reamer and saw after careful measurement with a caliper.  The patella was then prepared with the drill guide and a trial patella was placed.  The knee was then taken through range of motion and it was found that the patella articulated appropriately with the trochlea and good patellofemoral motion without subluxation.    The correct final components for implantation were confirmed and opened by the circulator nurse.  The prepared surfaces of the patella femur and tibia were cleaned with pulsatile lavage to remove all blood fat and other material and then the surfaces were dried.  2 bags of cement were mixed under vacuum and the components were cemented into place.  Excess cement was removed with curettes and forceps. A trial polyethylene tibial component was placed and the knee was brought into extension to allow the cement to set.  At this time the periarticular injection cocktail was placed in the soft tissues surrounding the knee.  After full curing of the cement the balance of the knee was checked again and the final polyethylene size was confirmed. The tibial component was irrigated and locking mechanism checked to ensure it was clear of debris. The real polyethylene tibial component was implanted and the knee was brought through a range of motion.   The knee was then irrigated with copious amount of normal saline via pulsatile lavage to remove all loose bodies and other debris.   The knee was then irrigated with irrisept wash and reirrigated with saline.  The tourniquet was then dropped and all bleeding vessels were identified and coagulated.  The arthrotomy was approximated with #1 Vicryl and closed with #2 Quill suture.  The knee was brought into slight flexion and the subcutaneous tissues were closed with 0 Vicryl, 2-0 Vicryl and a running subcuticular 3-0 monoderm quil suture.  Skin was then glued with Dermabond.  A sterile adhesive dressing was then placed along with a sequential compression device to the calf, a Ted stocking, and a cryotherapy cuff.   Sponge, needle, and Lap counts were all correct at the end of the case.   The patient was transferred off of the operating room table to a hospital bed, good pulses were found distally on the operative side.  The patient was transferred to the recovery room in stable condition.

## 2023-02-12 NOTE — Evaluation (Signed)
Physical Therapy Evaluation Patient Details Name: Alexander Rogers MRN: [id number] DOB: [birth year] Today's Date: 02/12/2023  History of Present Illness  Pt admitted for L TKR. History includes DM, HTN, and HLD. Pt is POD 0 at time of evaluation.  Clinical Impression  Pt is a pleasant 67 year old male who was admitted for L TKR. Pt performs bed mobility, transfers, and ambulation with cga and RW. Pt demonstrates ability to perform 10 SLRs with independence, therefore does not require KI for mobility. Pt demonstrates deficits with strength/mobility. Would benefit from skilled PT to address above deficits and promote optimal return to PLOF. Recommend transition to [place] upon discharge from acute hospitalization.       Recommendations for follow up therapy are one component of a multi-disciplinary discharge planning process, led by the attending physician.  Recommendations may be updated based on patient status, additional functional criteria and insurance authorization.  Follow Up Recommendations Home health PT      Assistance Recommended at Discharge Set up Supervision/Assistance  Patient can return home with the following  A little help with walking and/or transfers;A little help with bathing/dressing/bathroom;Help with stairs or ramp for entrance    Equipment Recommendations Rolling walker (2 wheels)  Recommendations for Other Services       Functional Status Assessment Patient has had a recent decline in their functional status and demonstrates the ability to make significant improvements in function in a reasonable and predictable amount of time.     Precautions / Restrictions Precautions Precautions: Fall;Knee Precaution Booklet Issued: No Restrictions Weight Bearing Restrictions: Yes LLE Weight Bearing: Weight bearing as tolerated      Mobility  Bed Mobility Overal bed mobility: Needs Assistance Bed Mobility: Supine to Sit     Supine to sit: Min guard     General  bed mobility comments: safe technique with upright posture noted. No dizziness    Transfers Overall transfer level: Needs assistance Equipment used: Rolling walker (2 wheels) Transfers: Sit to/from Stand Sit to Stand: Min guard           General transfer comment: safe technique with upright posture. RW used    Ambulation/Gait Ambulation/Gait assistance: Counsellor (Feet): 50 Feet Assistive device: Rolling walker (2 wheels) Gait Pattern/deviations: Step-through pattern       General Gait Details: ambulated to bathroom and back with reciprocal gait pattern and safe technique. UPright posture noted  Stairs            Wheelchair Mobility    Modified Rankin (Stroke Patients Only)       Balance Overall balance assessment: Needs assistance Sitting-balance support: Feet supported Sitting balance-Leahy Scale: Good     Standing balance support: Bilateral upper extremity supported Standing balance-Leahy Scale: Good                               Pertinent Vitals/Pain Pain Assessment Pain Assessment: No/denies pain    Home Living Family/patient expects to be discharged to:: Private residence Living Arrangements: Spouse/significant other Available Help at Discharge: Family;Available 24 hours/day Type of Home: House Home Access: Level entry       Home Layout: Two level;Able to live on main level with bedroom/bathroom Home Equipment: None      Prior Function Prior Level of Function : Independent/Modified Independent             Mobility Comments: indep, reports no falls ADLs Comments: indep, plays in local band  Hand Dominance        Extremity/Trunk Assessment   Upper Extremity Assessment Upper Extremity Assessment: Overall WFL for tasks assessed    Lower Extremity Assessment Lower Extremity Assessment: Generalized weakness (L LE grossly 3+/5)       Communication   Communication: No difficulties  Cognition  Arousal/Alertness: Awake/alert Behavior During Therapy: WFL for tasks assessed/performed Overall Cognitive Status: Within Functional Limits for tasks assessed                                          General Comments      Exercises Other Exercises Other Exercises: ambulated to bathroom with cga. Able to void in urinal Other Exercises: noticed that polar care isn't working, motor not running. Asked RN for new machine   Assessment/Plan    PT Assessment Patient needs continued PT services  PT Problem List Decreased strength;Decreased range of motion;Decreased activity tolerance;Decreased mobility;Pain       PT Treatment Interventions DME instruction;Gait training;Stair training;Therapeutic exercise    PT Goals (Current goals can be found in the Care Plan section)  Acute Rehab PT Goals Patient Stated Goal: to go home PT Goal Formulation: With patient Time For Goal Achievement: 02/26/23 Potential to Achieve Goals: Good    Frequency BID     Co-evaluation               AM-PAC PT "6 Clicks" Mobility  Outcome Measure Help needed turning from your back to your side while in a flat bed without using bedrails?: A Little Help needed moving from lying on your back to sitting on the side of a flat bed without using bedrails?: A Little Help needed moving to and from a bed to a chair (including a wheelchair)?: A Little Help needed standing up from a chair using your arms (e.g., wheelchair or bedside chair)?: A Little Help needed to walk in hospital room?: A Little Help needed climbing 3-5 steps with a railing? : A Little 6 Click Score: 18    End of Session Equipment Utilized During Treatment: Gait belt Activity Tolerance: Patient tolerated treatment well Patient left: in bed;with SCD's reapplied Nurse Communication: Mobility status PT Visit Diagnosis: Muscle weakness (generalized) (M62.81);Difficulty in walking, not elsewhere classified (R26.2);Pain Pain -  Right/Left: Left Pain - part of body: Knee    Time: [id number] PT Time Calculation (min) (ACUTE ONLY): 32 min   Charges:   PT Evaluation $PT Eval Low Complexity: 1 Low PT Treatments $Gait Training: 8-22 mins        Greggory Stallion, PT, DPT, GCS [phone]   [name] 02/12/2023, 4:51 PM

## 2023-02-12 NOTE — Progress Notes (Signed)
PT at bedside assisted to stand to attempt to void then ambulated to restroom

## 2023-02-12 NOTE — Anesthesia Preprocedure Evaluation (Signed)
Anesthesia Evaluation  Patient identified by MRN, date of birth, ID band Patient awake    Reviewed: Allergy & Precautions, NPO status , Patient's Chart, lab work & pertinent test results  History of Anesthesia Complications Negative for: history of anesthetic complications  Airway Mallampati: IV  TM Distance: >3 FB Neck ROM: full    Dental  (+) Teeth Intact, Dental Advidsory Given   Pulmonary asthma , sleep apnea and Continuous Positive Airway Pressure Ventilation     + decreased breath sounds      Cardiovascular hypertension, Pt. on medications  Rhythm:Regular Rate:Normal     Neuro/Psych   Anxiety        GI/Hepatic negative GI ROS, Neg liver ROS,,,  Endo/Other  diabetes, Type 2, Oral Hypoglycemic Agents  Morbid obesity  Renal/GU negative Renal ROS     Musculoskeletal   Abdominal  (+) + obese  Peds negative pediatric ROS (+)  Hematology   Anesthesia Other Findings   Reproductive/Obstetrics                             Anesthesia Physical Anesthesia Plan  ASA: 3  Anesthesia Plan: General/Spinal   Post-op Pain Management:    Induction: Intravenous  PONV Risk Score and Plan: Midazolam, TIVA, Propofol infusion and Ondansetron  Airway Management Planned: Natural Airway and Nasal Cannula  Additional Equipment:   Intra-op Plan:   Post-operative Plan:   Informed Consent: I have reviewed the patients History and Physical, chart, labs and discussed the procedure including the risks, benefits and alternatives for the proposed anesthesia with the patient or authorized representative who has indicated his/her understanding and acceptance.     Dental Advisory Given  Plan Discussed with: Surgeon  Anesthesia Plan Comments: (Patient reports no bleeding problems and no anticoagulant use.  Plan for spinal with backup GA  Patient consented for risks of anesthesia including but not  limited to:  - adverse reactions to medications - damage to eyes, teeth, lips or other oral mucosa - nerve damage due to positioning  - risk of bleeding, infection and or nerve damage from spinal that could lead to paralysis - risk of headache or failed spinal - damage to teeth, lips or other oral mucosa - sore throat or hoarseness - damage to heart, brain, nerves, lungs, other parts of body or loss of life  Patient voiced understanding.)        Anesthesia Quick Evaluation

## 2023-02-12 NOTE — Interval H&P Note (Signed)
Patient history and physical updated. Consent reviewed including risks, benefits, and alternatives to surgery. Patient agrees with above plan to proceed with left total knee arthroplasty.

## 2023-02-12 NOTE — Anesthesia Procedure Notes (Signed)
Spinal  Patient location during procedure: OR Start time: 02/12/2023 10:53 AM End time: 02/12/2023 11:08 AM Reason for block: surgical anesthesia Staffing Performed: resident/CRNA  Resident/CRNA: Lia Foyer, CRNA Performed by: Lia Foyer, CRNA Authorized by: Dimas Millin, MD   Preanesthetic Checklist Completed: patient identified, IV checked, site marked, risks and benefits discussed, surgical consent, monitors and equipment checked, pre-op evaluation and timeout performed Spinal Block Patient position: sitting Prep: DuraPrep Patient monitoring: heart rate, cardiac monitor, continuous pulse ox and blood pressure Approach: midline Location: L3-4 Injection technique: single-shot Needle Needle type: Pencan  Needle gauge: 24 G Needle length: 9 cm Assessment Sensory level: T4 Events: CSF return

## 2023-02-12 NOTE — Transfer of Care (Signed)
Immediate Anesthesia Transfer of Care Note  Patient: Alexander Rogers  Procedure(s) Performed: TOTAL KNEE ARTHROPLASTY (Left: Knee)  Patient Location: PACU  Anesthesia Type:General  Level of Consciousness: drowsy  Airway & Oxygen Therapy: Patient Spontanous Breathing  Post-op Assessment: Report given to RN and Post -op Vital signs reviewed and stable  Post vital signs: Reviewed and stable  Last Vitals:  Vitals Value Taken Time  BP 122/69 02/12/23 1315  Temp 36.3 C 02/12/23 1315  Pulse 93 02/12/23 1318  Resp 19 02/12/23 1318  SpO2 97 % 02/12/23 1318  Vitals shown include unvalidated device data.  Last Pain:  Vitals:   02/12/23 0904  TempSrc: Oral         Complications: No notable events documented.

## 2023-02-12 NOTE — H&P (Signed)
History of Present Illness: The patient is an 67 y.o. male seen in clinic today for evaluation of bilateral knees. Patient reports has had pain in both of his knees for years which has been worsening significantly over the past 6 to 7 months. He reports the pain is worse in his left knee than his right knee. Patient describes the pain as a constant aching pain over the medial side of his knees and anterior knees with associated crunching, clicking, and popping. He reports the pain is worse when going up and down stairs or with increased ambulation. He is unable to walk on inclines or declines due to the pain in his knees. Reports the pain is worse is a 9 out of 10. He has been treated with physical therapy, meloxicam, TENS unit, and several injections with the most recent being in August 2023. He reports he is underwent gel and steroid injections with diminishing relief and that he received almost no relief from his last injection. He reports the pain has become a functionally limiting issue for him affecting his activities of daily living and preventing him from walking and participating in life. The patient denies fevers, chills, numbness, tingling, shortness of breath, chest pain, recent illness, or any trauma.  The patient is a non-smoker his BMI is 31.4 and his last hemoglobin A1c was 7.1  Past Medical History: Past Medical History: Diagnosis Date Allergic rhinitis due to allergen Allergic state Anxiety Arthritis 05/29/2017 Asthma without status asthmaticus Bronchitis, chronic (CMS-HCC) Depression, major, single episode, complete remission (CMS-HCC) 11/28/2016 Diverticulosis 05/22/2017 Fatty liver History of meniscal tear History of pneumonia 2009 Hyperlipidemia Hypertension Obesity Pneumonia Primary osteoarthritis of both knees 05/29/2017 RLS (restless legs syndrome) Sleep apnea on CPAP Type 2 diabetes mellitus (CMS-HCC) 02/2015  Past Surgical History: Past Surgical History: Procedure  Laterality Date Vasectomy [place] [id number] umbilical COLONOSCOPY N/A 04/12/2006 Dr. Ivor Messier @ [facility] - Adenomatous Polyp COLONOSCOPY N/A 06/29/2011 Dr. Mamie Nick. Oh @ [facility] - Hemorrhoids, Poor Prep, PHP COLONOSCOPY N/A 05/22/2017 Dr. Kurtis Bushman @ [facility] - Diverticulosis, PHP, 5 yr rpt per [facility] (07/14/22 ltr [email or web address]) Elbow surgery Right biceps tendon HEMORROIDECTOMY 1980s with banading KNEE ARTHROSCOPY 2009? Meniscus Tear Knee surgery Left meniscus tear TONSILLECTOMY as a child as a child  Past Family History: Family History Problem Relation Age of Onset Atopy Mother Arthritis Mother COPD Mother Anxiety Mother Depression Asthma Mother Osteoarthritis Mother Emphysema Mother Basal cell carcinoma Father Atopy Father High blood pressure (Hypertension) Father Skin cancer Father Asthma Sister Emphysema Maternal Grandmother Frank breathing disorders; had emphysema. Allergies Other Alcohol abuse Son Anxiety Sister Asthma Sister Asthma Sister Colon cancer Neg Hx Colon polyps Neg Hx  Medications: Current Outpatient Medications Ordered in Epic Medication Sig Dispense Refill ACCU-CHEK SMARTVIEW TEST STRIP test strip USE TO TEST BLOOD SUGAR ONCE DAILY 100 each 0 alcohol swabs (ALCOHOL PREP SWABS) PadM Apply 1 each topically every 7 (seven) days Before Ozempic administration. E11.9 100 each 3 amLODIPine (NORVASC) 10 MG tablet TAKE 1 TABLET BY MOUTH EVERY DAY 90 tablet 1 beclomethasone dipropionate (QVAR REDIHALER HFA) 40 mcg/actuation inhaler Inhale 1 inhalation into the lungs 2 (two) times daily 10.6 g 6 blood-glucose meter (ACCU-CHEK NANO) Misc 1 each by XX route as directed 1 each 0 DULoxetine (CYMBALTA) 30 MG DR capsule Take 30 mg by mouth once daily (Patient not taking: Reported on 12/05/2022) escitalopram oxalate (LEXAPRO) 20 MG tablet TAKE 1 TABLET BY MOUTH EVERY DAY 90 tablet 1 fluticasone propionate (FLONASE) 50 mcg/actuation nasal spray Place 2  sprays into both  nostrils once daily (Patient not taking: Reported on 12/05/2022) lancing device (ACCU-CHEK SOFTCLIX LANCET DEV) Misc Use to check blood sugars 1 time a day 30 each 5 losartan (COZAAR) 100 MG tablet TAKE 1 TABLET BY MOUTH ONCE DAILY (Patient not taking: Reported on 01/03/2023) 90 tablet 3 meloxicam (MOBIC) 15 MG tablet TAKE 1 TABLET BY MOUTH EVERY DAY 30 tablet 5 metFORMIN (GLUCOPHAGE) 1000 MG tablet TAKE 1 TABLET BY MOUTH TWICE A DAY WITH MEALS 180 tablet 1 mometasone (ELOCON) 0.1 % lotion INSTILL 4 DROPS IN EACH EAR AT BEDTIME AS NEEDED FOR DRY SKIN AND ITCHING 12 montelukast (SINGULAIR) 10 mg tablet Take 1 tablet (10 mg total) by mouth every evening 90 tablet 3 pen needle, diabetic 31 gauge x 5/16" needle Use as directed With Ozempic pens E11.9 100 each 3 pramipexole (MIRAPEX) 0.5 MG tablet TAKE 1 TABLET BY MOUTH NIGHTLY 90 tablet 1 semaglutide (OZEMPIC) 0.25 mg or 0.5 mg(2 mg/1.5 mL) pen injector Inject 0.375 mLs (0.5 mg total) subcutaneously once a week 4.5 mL 1 sodium, potassium, and magnesium (SUPREP) oral solution Take 2 Bottles (1 kit total) by mouth as directed One kit contains 2 bottles. Take both bottles at the times instructed by your provider. 354 mL 0 tamsulosin (FLOMAX) 0.4 mg capsule Take 1 capsule (0.4 mg total) by mouth once daily for 14 days Take 30 minutes after same meal each day. 14 capsule 0  No current Epic-ordered facility-administered medications on file.  Allergies: Allergies Allergen Reactions Glutamine-C-Quercet-Selen-Brom Anaphylaxis tomatoes Fish Containing Products Swelling Was told to avoid shell fish; able to eat some fish Tomato Rash   Review of Systems: A comprehensive 14 point ROS was performed, reviewed, and the pertinent orthopaedic findings are documented in the HPI.  Physical Exam: General/Constitutional: No apparent distress: well-nourished and well developed. Eyes: Pupils equal, round with synchronous movement. Respiratory: "Non-labored  breathing. Cardiac: Heart rate is regular. Integumentary: No impressive skin lesions present, except as noted in detailed exam. Neuro/Psych: Normal mood and affect, oriented to person, place and time.  Comprehensive Knee Exam: Gait Antalgic and stiff Alignment Neutral  Inspection Right Left Skin Normal appearance with no obvious deformity. No ecchymosis or erythema. Normal appearance with no obvious deformity. No ecchymosis or erythema. Soft Tissue No focal soft tissue swelling No focal soft tissue swelling Quad Atrophy None None  Palpation Right Left Tenderness Medial joint line tenderness to palpation Medial joint line and parapatellar tenderness to palpation Crepitus + patellofemoral and tibiofemoral crepitus + patellofemoral and tibiofemoral crepitus Effusion None None  Range of Motion              Right      Left Flexion 0-125     0-125 Extension Full knee extension without hyperextension Full knee extension without hyperextension  Ligamentous Exam Right Left Lachman Normal Normal Valgus 0 Normal Normal Valgus 30 Normal Normal Varus 0 Normal Normal Varus 30 Normal Normal Anterior Drawer Normal Normal Posterior Drawer Normal Normal  Meniscal Exam Right Left Hyperflexion Test Negative Positive Hyperextension Test Positive Negative McMurray's Negative Positive  Neurovascular Right Left Quadriceps Strength 5/5 5/5 Hamstring Strength 5/5 5/5 Hip Abductor Strength 4/5 4/5 Distal Motor Normal Normal Distal Sensory Normal light touch sensation Normal light touch sensation Distal Pulses Normal Normal   Imaging Studies: I have reviewed AP, lateral,sunrise, and flexed PA weight bearing knee X-rays (4 views) of the bilateral knees ordered and taken today in the office show left knee with moderate/severe degenerative changes with medial and patellofemoral  joint space narrowing medial bone-on-bone articulation with osteophyte formation, subchondral cysts formation and  sclerosis. Kellgren-Lawrence grade 3-4 AP, sunrise, and flexed PA of the right knee also show medial and patellofemoral joint space narrowing with bone-on-bone articulation in the medial joint space with osteophyte formation, sclerosis.. Kellgren-Lawrence grade 3.  Assessment: ICD-10-CM 1. Primary localized osteoarthritis of both knees M17.0   Plan: Zakarie is a 67 year old male who presents with bilateral knee osteoarthritis with more pain and dysfunction on the left side. Based upon the patient's continued symptoms and failure to respond to conservative treatment, I have recommended a left total knee replacement for this patient. A long discussion took place with the patient describing what a total joint replacement is and what the procedure would entail. A knee model, similar to the implants that will be used during the operation, was utilized to demonstrate the implants. Choices of implant manufactures were discussed and reviewed. The ability to secure the implant utilizing cement or cementless (press fit) fixation was discussed. The approach and exposure was discussed.  The hospitalization and post-operative care and rehabilitation were also discussed. The use of perioperative antibiotics and DVT prophylaxis were discussed. The risk, benefits and alternatives to a surgical intervention were discussed at length with the patient. The patient was also advised of risks related to the medical comorbidities and elevated body mass index (BMI). A lengthy discussion took place to review the most common complications including but not limited to: stiffness, loss of function, complex regional pain syndrome, deep vein thrombosis, pulmonary embolus, heart attack, stroke, infection, wound breakdown, numbness, intraoperative fracture, damage to nerves, tendon,muscles, arteries or other blood vessels, death and other possible complications from anesthesia. The patient was told that we will take steps to minimize  these risks by using sterile technique, antibiotics and DVT prophylaxis when appropriate and follow the patient postoperatively in the office setting to monitor progress. The possibility of recurrent pain, no improvement in pain and actual worsening of pain were also discussed with the patient.  The discharge plan of care focused on the patient going home following surgery. The patient was encouraged to make the necessary arrangements to have someone stay with them when they are discharged home.  The benefits of surgery were discussed with the patient including the potential for improving the patient's current clinical condition through operative intervention. Alternatives to surgical intervention including continued conservative management were also discussed in detail. All questions were answered to the satisfaction of the patient. The patient participated and agreed to the plan of care as well as the use of the recommended implants for their total knee replacement surgery. An information packet was given to the patient to review prior to surgery.  The patient did receive medical and pulmonology clearance for surgery. All questions answered and the patient agrees with the above plan to proceed with a left total knee replacement after medical pulmonology clearance.  Portions of this record have been created using [employer]. Dictation errors have been sought, but may not have been identified and corrected.  Steffanie Rainwater MD

## 2023-02-13 ENCOUNTER — Encounter: Payer: Self-pay | Admitting: Orthopedic Surgery

## 2023-02-13 DIAGNOSIS — M1712 Unilateral primary osteoarthritis, left knee: Secondary | ICD-10-CM | POA: Diagnosis not present

## 2023-02-13 DIAGNOSIS — Z96652 Presence of left artificial knee joint: Secondary | ICD-10-CM | POA: Diagnosis not present

## 2023-02-13 LAB — BASIC METABOLIC PANEL
Anion gap: 12 (ref 5–15)
BUN: 23 mg/dL (ref 8–23)
CO2: 21 mmol/L — ABNORMAL LOW (ref 22–32)
Calcium: 9 mg/dL (ref 8.9–10.3)
Chloride: 100 mmol/L (ref 98–111)
Creatinine, Ser: 1.03 mg/dL (ref 0.61–1.24)
GFR, Estimated: >60 mL/min (ref >60)
Glucose, Bld: 285 mg/dL — ABNORMAL HIGH (ref 70–99)
Potassium: 4.1 mmol/L (ref 3.5–5.1)
Sodium: 133 mmol/L — ABNORMAL LOW (ref 135–145)

## 2023-02-13 LAB — GLUCOSE, CAPILLARY
Glucose-Capillary: 168 mg/dL — ABNORMAL HIGH (ref 70–99)
Glucose-Capillary: 256 mg/dL — ABNORMAL HIGH (ref 70–99)
Glucose-Capillary: 284 mg/dL — ABNORMAL HIGH (ref 70–99)

## 2023-02-13 LAB — CBC
HCT: 39.9 % (ref 39.0–52.0)
Hemoglobin: 13.2 g/dL (ref 13.0–17.0)
MCH: 29.1 pg (ref 26.0–34.0)
MCHC: 33.1 g/dL (ref 30.0–36.0)
MCV: 88.1 fL (ref 80.0–100.0)
Platelets: 192 10*3/uL (ref 150–400)
RBC: 4.53 MIL/uL (ref 4.22–5.81)
RDW: 12.8 % (ref 11.5–15.5)
WBC: 7.5 10*3/uL (ref 4.0–10.5)
nRBC: 0 % (ref 0.0–0.2)

## 2023-02-13 MED ORDER — CELECOXIB 200 MG PO CAPS: 200.0000 mg | ORAL_CAPSULE | Freq: Every day | ORAL | 0 refills | Status: AC

## 2023-02-13 MED ORDER — TRAMADOL HCL 50 MG PO TABS: 50.0000 mg | ORAL_TABLET | Freq: Four times a day (QID) | ORAL | 0 refills | Status: DC | PRN

## 2023-02-13 MED ORDER — ACETAMINOPHEN 500 MG PO TABS: 1000.0000 mg | ORAL_TABLET | Freq: Three times a day (TID) | ORAL | 0 refills | Status: DC

## 2023-02-13 MED ORDER — DOCUSATE SODIUM 100 MG PO CAPS: 100.0000 mg | ORAL_CAPSULE | Freq: Two times a day (BID) | ORAL | 0 refills | Status: DC

## 2023-02-13 MED ORDER — ONDANSETRON HCL 4 MG PO TABS: 4.0000 mg | ORAL_TABLET | Freq: Four times a day (QID) | ORAL | 0 refills | Status: DC | PRN

## 2023-02-13 MED ORDER — ENOXAPARIN SODIUM 40 MG/0.4ML IJ SOSY: 40.0000 mg | PREFILLED_SYRINGE | INTRAMUSCULAR | 0 refills | Status: DC

## 2023-02-13 NOTE — Progress Notes (Signed)
Patient still waiting for the walker.  Pending discharge.

## 2023-02-13 NOTE — Progress Notes (Addendum)
   Subjective: 1 Day Post-Op Procedure(s) (LRB): TOTAL KNEE ARTHROPLASTY (Left) Patient reports pain as mild.   Patient is well, and has had no acute complaints or problems Denies any CP, SOB, ABD pain. We will continue therapy today.  Plan is to go Home after hospital stay.  Objective: Vital signs in last 24 hours: Temp:  [97.3 F (36.3 C)-98.3 F (36.8 C)] 98.1 F (36.7 C) (02/20 0728) Pulse Rate:  [63-93] 65 (02/20 0728) Resp:  [15-25] 17 (02/20 0728) BP: (119-192)/(69-102) 152/91 (02/20 0728) SpO2:  [92 %-99 %] 98 % (02/20 0728) Weight:  [103.6 kg] 103.6 kg (02/19 0904)  Intake/Output from previous day: 02/19 0701 - 02/20 0700 In: 2800 [P.O.:300; I.V.:2100; IV Piggyback:400] Out: 2255 [Urine:2225; Blood:30] Intake/Output this shift: Total I/O In: -  Out: 200 [Urine:200]  Recent Labs    02/13/23 0156  HGB 13.2   Recent Labs    02/13/23 0156  WBC 7.5  RBC 4.53  HCT 39.9  PLT 192   Recent Labs    02/13/23 0156  NA 133*  K 4.1  CL 100  CO2 21*  BUN 23  CREATININE 1.03  GLUCOSE 285*  CALCIUM 9.0   No results for input(s): "LABPT", "INR" in the last 72 hours.  EXAM General - Patient is Alert, Appropriate, and Oriented Extremity - Sensation intact distally Intact pulses distally Dorsiflexion/Plantar flexion intact Dressing - dressing C/D/I and no drainage Motor Function - intact, moving foot and toes well on exam.   Past Medical History:  Diagnosis Date   Allergy    Seasonal   Arthritis    Asthma    Bronchitis    Decreased white blood cell count    Diabetes mellitus without complication (HCC)    Diverticulosis    GAD (generalized anxiety disorder)    History of kidney stones    History of meniscal tear    History of pneumonia    Hyperlipidemia    Hypertension    Leukopenia    Pneumonia    Restless leg syndrome    Sleep apnea    on CPAP   Thrombocytopenia (HCC)     Assessment/Plan:   1 Day Post-Op Procedure(s) (LRB): TOTAL KNEE  ARTHROPLASTY (Left) Principal Problem:   S/P TKR (total knee replacement) using cement, left  Estimated body mass index is 32.77 kg/m as calculated from the following:   Height as of this encounter: 5' 10"$  (1.778 m).   Weight as of this encounter: 103.6 kg. Advance diet Up with therapy Labs stable Vital signs are stable Patient doing very well.  Pain well-controlled. Care management to assist with discharge to home with home health PT today  DVT Prophylaxis - Lovenox, TED hose, and SCDs Weight-Bearing as tolerated to left leg   [name], PA-C [place] 02/13/2023, 8:20 AM   Patient seen and examined, agree with above plan.  The patient is doing well status post left total knee arthroplasty, no concerns at this time.  Pain is controlled.  Discussed DVT prophylaxis, pain medication use, and safe transition to home.  All questions answered the patient agrees with above plan will go home after clears PT.   Alexander Rainwater MD

## 2023-02-13 NOTE — Progress Notes (Signed)
Walker delivered to room.  Patient discharged home.

## 2023-02-13 NOTE — Progress Notes (Signed)
Discharge instructions given to the patient and his wife.  Orthopedic PA at bedside, reiterated wound/incision management at home.  Both patient and his wife verbalized understanding.  Needs addressed.  Waiting for DME.  Will be discharged to home.

## 2023-02-13 NOTE — Progress Notes (Cosign Needed)
Patient is not able to walk the distance required to go the bathroom, or he/she is unable to safely negotiate stairs required to access the bathroom.  A 3in1 BSC will alleviate this problem  

## 2023-02-13 NOTE — Progress Notes (Signed)
Physical Therapy Treatment Patient Details Name: Alexander Rogers MRN: [id number] DOB: June 03, 1956 Today's Date: 02/13/2023   History of Present Illness Pt admitted for L TKR. History includes DM, HTN, and HLD. Pt is POD 0 at time of evaluation.    PT Comments    Pt is making good progress towards goals with ability to ambulate around hallway demonstrating safe technique with use of AD. Reviewed written HEP and expectations for PT. Pt premedicated prior to session and ice applied. Great progress with ROM. Has met all goals for acute PT. Will plan to dc in house.  Recommendations for follow up therapy are one component of a multi-disciplinary discharge planning process, led by the attending physician.  Recommendations may be updated based on patient status, additional functional criteria and insurance authorization.  Follow Up Recommendations  Home health PT     Assistance Recommended at Discharge Set up Supervision/Assistance  Patient can return home with the following A little help with walking and/or transfers;A little help with bathing/dressing/bathroom;Help with stairs or ramp for entrance   Equipment Recommendations  Rolling walker (2 wheels)    Recommendations for Other Services       Precautions / Restrictions Precautions Precautions: Fall;Knee Precaution Booklet Issued: Yes (comment) Restrictions Weight Bearing Restrictions: Yes LLE Weight Bearing: Weight bearing as tolerated     Mobility  Bed Mobility Overal bed mobility: Needs Assistance Bed Mobility: Supine to Sit     Supine to sit: Supervision     General bed mobility comments: safe technique with ease of mobility    Transfers Overall transfer level: Needs assistance Equipment used: Rolling walker (2 wheels) Transfers: Sit to/from Stand Sit to Stand: Supervision           General transfer comment: cues for pushing from seated surface    Ambulation/Gait Ambulation/Gait assistance: Min  guard Gait Distance (Feet): 200 Feet Assistive device: Rolling walker (2 wheels) Gait Pattern/deviations: Step-through pattern       General Gait Details: ambulated with reciprocal gait pattern and symmetrical gait. RW used   Marine scientist Rankin (Stroke Patients Only)       Balance Overall balance assessment: Needs assistance Sitting-balance support: Feet supported Sitting balance-Leahy Scale: Good     Standing balance support: Bilateral upper extremity supported Standing balance-Leahy Scale: Good                              Cognition Arousal/Alertness: Awake/alert Behavior During Therapy: WFL for tasks assessed/performed Overall Cognitive Status: Within Functional Limits for tasks assessed                                 General Comments: pleasant and agreeable to session        Exercises Total Joint Exercises Goniometric ROM: L knee AROM: 0-103 degrees Other Exercises Other Exercises: reviewed written HEP and reviewed expectations.    General Comments        Pertinent Vitals/Pain Pain Assessment Pain Assessment: 0-10 Pain Score: 5  Pain Location: L knee Pain Descriptors / Indicators: Operative site guarding Pain Intervention(s): Limited activity within patient's tolerance, Premedicated before session, Repositioned, Ice applied    Home Living  Prior Function            PT Goals (current goals can now be found in the care plan section) Acute Rehab PT Goals Patient Stated Goal: to go home PT Goal Formulation: With patient Time For Goal Achievement: 02/26/23 Potential to Achieve Goals: Good Progress towards PT goals: Progressing toward goals    Frequency    BID      PT Plan Current plan remains appropriate    Co-evaluation              AM-PAC PT "6 Clicks" Mobility   Outcome Measure  Help needed turning from your back to  your side while in a flat bed without using bedrails?: None Help needed moving from lying on your back to sitting on the side of a flat bed without using bedrails?: None Help needed moving to and from a bed to a chair (including a wheelchair)?: None Help needed standing up from a chair using your arms (e.g., wheelchair or bedside chair)?: None Help needed to walk in hospital room?: A Little Help needed climbing 3-5 steps with a railing? : A Little 6 Click Score: 22    End of Session Equipment Utilized During Treatment: Gait belt Activity Tolerance: Patient tolerated treatment well Patient left: in chair;with family/visitor present Nurse Communication: Mobility status PT Visit Diagnosis: Muscle weakness (generalized) (M62.81);Difficulty in walking, not elsewhere classified (R26.2);Pain Pain - Right/Left: Left Pain - part of body: Knee     Time: [id number] PT Time Calculation (min) (ACUTE ONLY): 23 min  Charges:  $Gait Training: 8-22 mins $Therapeutic Exercise: 8-22 mins                     Greggory Stallion, PT, DPT, GCS [phone]    Alric Geise 02/13/2023, 10:42 AM

## 2023-02-13 NOTE — Anesthesia Postprocedure Evaluation (Signed)
Anesthesia Post Note  Patient: Alexander Rogers  Procedure(s) Performed: TOTAL KNEE ARTHROPLASTY (Left: Knee)  Patient location during evaluation: Nursing Unit Anesthesia Type: Combined General/Spinal Level of consciousness: oriented and awake and alert Pain management: pain level controlled Vital Signs Assessment: post-procedure vital signs reviewed and stable Respiratory status: spontaneous breathing and respiratory function stable Cardiovascular status: blood pressure returned to baseline and stable Postop Assessment: no headache, no backache, no apparent nausea or vomiting and patient able to bend at knees Anesthetic complications: no   No notable events documented.   Last Vitals:  Vitals:   02/13/23 0359 02/13/23 0728  BP: (!) 142/83 (!) 152/91  Pulse: 63 65  Resp: 16 17  Temp: 36.7 C 36.7 C  SpO2: 99% 98%    Last Pain:  Vitals:   02/13/23 0715  TempSrc:   PainSc: 1                  Brantley Fling

## 2023-02-13 NOTE — Discharge Summary (Signed)
Physician Discharge Summary  Patient ID: Alexander Rogers MRN: [id number] DOB/AGE: 1956/10/01 67 y.o.  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   Restless leg 07/05/2016   Apnea, sleep 07/05/2016   Thrombocytopenia ([place]) 07/05/2015   Obstructive apnea 10/14/2014    Past Medical History:  Diagnosis Date   Allergy    Seasonal   Arthritis    Asthma    Bronchitis    Decreased white blood cell count    Diabetes mellitus without complication (HCC)    Diverticulosis    GAD (generalized anxiety disorder)    History of kidney stones    History of meniscal tear    History of pneumonia    Hyperlipidemia    Hypertension    Leukopenia    Pneumonia    Restless leg syndrome    Sleep apnea    on CPAP   Thrombocytopenia ([place])      Transfusion: None   Consultants (if any):   Discharged Condition: Improved  Hospital Course: Alexander Rogers is an 67 y.o. male who was admitted 02/12/2023 with a diagnosis of S/P TKR (total knee replacement) using cement, left and went to the operating room on 02/12/2023 and underwent the above named procedures.    Surgeries: Procedure(s): TOTAL KNEE ARTHROPLASTY on 02/12/2023 Patient tolerated the surgery well. Taken to PACU where she was stabilized and then transferred to the orthopedic floor.  Started on Lovenox 30 mg q 12 hrs. TEDs and SCDs applied bilaterally. Heels elevated on bed. No evidence of DVT. Negative [place]. Physical therapy started on day #1 for gait training and transfer. OT started day #1 for ADL and assisted devices. Patient's IV was d/c on day #1. Patient was able to safely and independently complete all PT  goals. PT recommending discharge to home.  On post op day #1 patient was stable and ready for discharge to home with home health PT.  Implants: Femur: Persona Size 7 CR Std   Tibia: Persona Size F w/ 14x29m stem extension Poly: 189mMC  Patella: 32x8.24m26mymmetric cemented   He was given perioperative antibiotics:  Anti-infectives (From admission, onward)    Start     Dose/Rate Route Frequency Ordered Stop   02/12/23 1730  ceFAZolin (ANCEF) IVPB 2g/100 mL premix        2 g 200 mL/hr over 30 Minutes Intravenous Every 6 hours 02/12/23 1719 02/13/23 0003   02/12/23 0856  ceFAZolin (ANCEF) 2-4 GM/100ML-% IVPB       Note to Pharmacy: IbaRebecca Eaton cabinet override      02/12/23 0856 02/12/23 1116   02/12/23 0600  ceFAZolin (ANCEF) IVPB 2g/100 mL premix        2 g 200 mL/hr over 30 Minutes Intravenous On call to O.R. 02/11/23 2145 02/12/23 1113     .  He was given sequential compression devices, early ambulation, and Lovenox, teds for DVT prophylaxis.  He benefited maximally from the hospital stay and there were no complications.    Recent vital signs:  Vitals:   02/13/23 0359 02/13/23 0728  BP: (!) 142/83 (!) 152/91  Pulse: 63 65  Resp: 16 17  Temp: 98 F (36.7 C) 98.1 F (36.7  C)  SpO2: 99% 98%    Recent laboratory studies:  Lab Results  Component Value Date   HGB 13.2 02/13/2023   HGB 14.3 01/31/2023   HGB 14.6 01/23/2018   Lab Results  Component Value Date   WBC 7.5 02/13/2023   PLT 192 02/13/2023   Lab Results  Component Value Date   INR 1.0 03/30/2015   Lab Results  Component Value Date   NA 133 (L) 02/13/2023   K 4.1 02/13/2023   CL 100 02/13/2023   CO2 21 (L) 02/13/2023   BUN 23 02/13/2023   CREATININE 1.03 02/13/2023   GLUCOSE 285 (H) 02/13/2023    Discharge Medications:   Allergies as of 02/13/2023       Reactions   Fish Allergy Other (See Comments)   Was told to avoid shell fish; able to eat some fish   Tomato Rash        Medication  List     STOP taking these medications    meloxicam 15 MG tablet Commonly known as: MOBIC       TAKE these medications    acetaminophen 500 MG tablet Commonly known as: TYLENOL Take 2 tablets (1,000 mg total) by mouth every 8 (eight) hours.   albuterol 108 (90 Base) MCG/ACT inhaler Commonly known as: VENTOLIN HFA Inhale 2 puffs into the lungs 4 (four) times daily as needed.   amLODipine 10 MG tablet Commonly known as: NORVASC Take 10 mg by mouth daily.   celecoxib 200 MG capsule Commonly known as: CeleBREX Take 1 capsule (200 mg total) by mouth daily for 14 days.   docusate sodium 100 MG capsule Commonly known as: COLACE Take 1 capsule (100 mg total) by mouth 2 (two) times daily.   enoxaparin 40 MG/0.4ML injection Commonly known as: LOVENOX Inject 0.4 mLs (40 mg total) into the skin daily for 14 days.   escitalopram 20 MG tablet Commonly known as: LEXAPRO Take 20 mg by mouth daily.   glucose blood test strip   loratadine 10 MG tablet Commonly known as: CLARITIN Take 10 mg by mouth as needed for allergies.   metFORMIN 500 MG 24 hr tablet Commonly known as: GLUCOPHAGE-XR Take 1,000 mg by mouth daily with breakfast.   Mometasone Furo & Dimethicone 0.1 & 5 % Thpk Apply 1 drop topically as needed. To ears   montelukast 10 MG tablet Commonly known as: SINGULAIR Take 10 mg by mouth as needed.   ondansetron 4 MG tablet Commonly known as: ZOFRAN Take 1 tablet (4 mg total) by mouth every 6 (six) hours as needed for nausea.   Ozempic (0.25 or 0.5 MG/DOSE) 2 MG/3ML Sopn Generic drug: Semaglutide(0.25 or 0.5MG/DOS) Inject 0.5 mg into the skin once a week. On Wednesday   pramipexole 0.25 MG tablet Commonly known as: MIRAPEX Take 0.25 mg by mouth daily.   traMADol 50 MG tablet Commonly known as: ULTRAM Take 1 tablet (50 mg total) by mouth every 6 (six) hours as needed for moderate pain.               Durable Medical Equipment  (From admission,  onward)           Start     Ordered   02/13/23 0808  For home use only DME Walker rolling  Once       Question Answer Comment  Walker: With 5 Inch Wheels   Patient needs a walker to treat with the following condition S/P TKR (total knee replacement)  02/13/23 [id number]            Diagnostic Studies: DG Knee Left Port  Result Date: 02/12/2023 CLINICAL DATA:  Total knee arthroplasty EXAM: PORTABLE LEFT KNEE - 1-2 VIEW COMPARISON:  MRI 06/03/2010 FINDINGS: Total knee arthroplasty prosthetic in place with components in expected positioning. No periprosthetic fracture or early complicating feature. Expected gas in the joint and adjacent soft tissues. IMPRESSION: 1. Left total knee arthroplasty without complicating feature. Electronically Signed   By: Van Clines M.D.   On: 02/12/2023 13:51    Disposition:      Follow-up Information     Duanne Guess, PA-C Follow up in 2 week(s).   Specialties: Orthopedic Surgery, Emergency Medicine Contact information: [place] [address] [phone]                  Signed: Feliberto Gottron 02/13/2023, 12:01 PM

## 2023-02-13 NOTE — Discharge Instructions (Signed)
Instructions after Total Knee Replacement   Alexander Rogers M.D.     Dept. of [facility]  [place], [place]  [address]  Phone: [phone]   Fax: [phone]    DIET: Drink plenty of non-alcoholic fluids. Resume your normal diet. Include foods high in fiber.  ACTIVITY:  You may use crutches or a walker with weight-bearing as tolerated, unless instructed otherwise. You may be weaned off of the walker or crutches by your Physical Therapist.  Do NOT place pillows under the knee. Anything placed under the knee could limit your ability to straighten the knee.   Continue doing gentle exercises. Exercising will reduce the pain and swelling, increase motion, and prevent muscle weakness.   Please continue to use the TED compression stockings for 2 weeks. You may remove the stockings at night, but should reapply them in the morning. Do not drive or operate any equipment until instructed.  WOUND CARE:  Continue to use the PolarCare or ice packs periodically to reduce pain and swelling. You may begin showering 3 days after surgery with honeycomb dressing. Remove honeycomb dressing 7 days after surgery and continue showering. Allow dermabond to fall off on its own.  MEDICATIONS: You may resume your regular medications. Please take the pain medication as prescribed on the medication. Do not take pain medication on an empty stomach. You have been given a prescription for a blood thinner (Lovenox or Coumadin). Please take the medication as instructed. (NOTE: After completing a 2 week course of Lovenox, take one 81 mg Enteric-coated aspirin twice a day. This along with elevation will help reduce the possibility of phlebitis in your operated leg.) Do not drive or drink alcoholic beverages when taking pain medications.  POSTOPERATIVE CONSTIPATION PROTOCOL Constipation - defined medically as fewer than three stools per week and severe  constipation as less than one stool per week.  One of the most common issues patients have following surgery is constipation.  Even if you have a regular bowel pattern at home, your normal regimen is likely to be disrupted due to multiple reasons following surgery.  Combination of anesthesia, postoperative narcotics, change in appetite and fluid intake all can affect your bowels.  In order to avoid complications following surgery, here are some recommendations in order to help you during your recovery period.  Colace (docusate) - Pick up an over-the-counter form of Colace or another stool softener and take twice a day as long as you are requiring postoperative pain medications.  Take with a full glass of water daily.  If you experience loose stools or diarrhea, hold the colace until you stool forms back up.  If your symptoms do not get better within 1 week or if they get worse, check with your doctor.  Dulcolax (bisacodyl) - Pick up over-the-counter and take as directed by the product packaging as needed to assist with the movement of your bowels.  Take with a full glass of water.  Use this product as needed if not relieved by Colace only.   MiraLax (polyethylene glycol) - Pick up over-the-counter to have on hand.  MiraLax is a solution that will increase the amount of water in your bowels to assist with bowel movements.  Take as directed and can mix with a glass of water, juice, soda, coffee, or tea.  Take if you go more than two days without a movement. Do not use MiraLax more than once per day. Call your doctor if  you are still constipated or irregular after using this medication for 7 days in a row.  If you continue to have problems with postoperative constipation, please contact the office for further assistance and recommendations.  If you experience "the worst abdominal pain ever" or develop nausea or vomiting, please contact the office immediatly for further recommendations for  treatment.   CALL THE OFFICE FOR: Temperature above 101 degrees Excessive bleeding or drainage on the dressing. Excessive swelling, coldness, or paleness of the toes. Persistent nausea and vomiting.  FOLLOW-UP:  You should have an appointment to return to the office in 14 days after surgery. Arrangements have been made for continuation of Physical Therapy (either home therapy or outpatient therapy).

## 2023-02-13 NOTE — TOC Progression Note (Signed)
Transition of Care [facility]) - Progression Note    Patient Details  Name: Alexander Rogers MRN: [id number] Date of Birth: Sep 03, 1956  Transition of Care [facility]) CM/SW [place], RN Phone Number: 02/13/2023, 1:23 PM  Clinical Narrative:     The patient is set up with [place] for [facility] PT and Ot prior to surgery with the surgeons office [employer] to deliver a RW and 3 in1 to the bedside  Expected Discharge Plan: [place] Barriers to Discharge: No Barriers Identified  Expected Discharge Plan and Services   Discharge Planning Services: CM Consult Post Acute Care Choice: Home Health, Durable Medical Equipment Living arrangements for the past 2 months: [place] Expected Discharge Date: 02/13/23               DME Arranged: Gilford Rile rolling, 3-N-1 DME Agency: [employer] Date DME Agency Contacted: 02/13/23 Time DME Agency Contacted: [phone] Representative spoke with at DME Agency: Cyril Mourning HH Arranged: PT, OT HH Agency: [place] Date [place]: 02/13/23 Time [place]: 1321 Representative spoke with at [place]: [place]   Social Determinants of Health ([place]) Interventions SDOH Screenings   Food Insecurity: No Food Insecurity (02/12/2023)  Housing: Low Risk  (02/12/2023)  Transportation Needs: No Transportation Needs (02/12/2023)  Utilities: Not At Risk (02/12/2023)  Tobacco Use: Low Risk  (02/13/2023)    Readmission Risk Interventions     No data to display

## 2023-02-14 DIAGNOSIS — M1611 Unilateral primary osteoarthritis, right hip: Secondary | ICD-10-CM | POA: Diagnosis not present

## 2023-02-14 DIAGNOSIS — J42 Unspecified chronic bronchitis: Secondary | ICD-10-CM | POA: Diagnosis not present

## 2023-02-14 DIAGNOSIS — I10 Essential (primary) hypertension: Secondary | ICD-10-CM | POA: Diagnosis not present

## 2023-02-14 DIAGNOSIS — Z7985 Long-term (current) use of injectable non-insulin antidiabetic drugs: Secondary | ICD-10-CM | POA: Diagnosis not present

## 2023-02-14 DIAGNOSIS — G2581 Restless legs syndrome: Secondary | ICD-10-CM | POA: Diagnosis not present

## 2023-02-14 DIAGNOSIS — F325 Major depressive disorder, single episode, in full remission: Secondary | ICD-10-CM | POA: Diagnosis not present

## 2023-02-14 DIAGNOSIS — Z96652 Presence of left artificial knee joint: Secondary | ICD-10-CM | POA: Diagnosis not present

## 2023-02-14 DIAGNOSIS — J45909 Unspecified asthma, uncomplicated: Secondary | ICD-10-CM | POA: Diagnosis not present

## 2023-02-14 DIAGNOSIS — Z471 Aftercare following joint replacement surgery: Secondary | ICD-10-CM | POA: Diagnosis not present

## 2023-02-14 DIAGNOSIS — Z7984 Long term (current) use of oral hypoglycemic drugs: Secondary | ICD-10-CM | POA: Diagnosis not present

## 2023-02-14 DIAGNOSIS — G4730 Sleep apnea, unspecified: Secondary | ICD-10-CM | POA: Diagnosis not present

## 2023-02-14 DIAGNOSIS — K5790 Diverticulosis of intestine, part unspecified, without perforation or abscess without bleeding: Secondary | ICD-10-CM | POA: Diagnosis not present

## 2023-02-14 DIAGNOSIS — E785 Hyperlipidemia, unspecified: Secondary | ICD-10-CM | POA: Diagnosis not present

## 2023-02-14 DIAGNOSIS — E669 Obesity, unspecified: Secondary | ICD-10-CM | POA: Diagnosis not present

## 2023-02-14 DIAGNOSIS — E119 Type 2 diabetes mellitus without complications: Secondary | ICD-10-CM | POA: Diagnosis not present

## 2023-02-14 DIAGNOSIS — K760 Fatty (change of) liver, not elsewhere classified: Secondary | ICD-10-CM | POA: Diagnosis not present

## 2023-02-14 DIAGNOSIS — F419 Anxiety disorder, unspecified: Secondary | ICD-10-CM | POA: Diagnosis not present

## 2023-02-14 DIAGNOSIS — Z6833 Body mass index (BMI) 33.0-33.9, adult: Secondary | ICD-10-CM | POA: Diagnosis not present

## 2023-02-23 DIAGNOSIS — Z471 Aftercare following joint replacement surgery: Secondary | ICD-10-CM | POA: Diagnosis not present

## 2023-02-23 DIAGNOSIS — Z6833 Body mass index (BMI) 33.0-33.9, adult: Secondary | ICD-10-CM | POA: Diagnosis not present

## 2023-02-23 DIAGNOSIS — E119 Type 2 diabetes mellitus without complications: Secondary | ICD-10-CM | POA: Diagnosis not present

## 2023-02-23 DIAGNOSIS — Z7985 Long-term (current) use of injectable non-insulin antidiabetic drugs: Secondary | ICD-10-CM | POA: Diagnosis not present

## 2023-02-23 DIAGNOSIS — Z96652 Presence of left artificial knee joint: Secondary | ICD-10-CM | POA: Diagnosis not present

## 2023-02-23 DIAGNOSIS — G4730 Sleep apnea, unspecified: Secondary | ICD-10-CM | POA: Diagnosis not present

## 2023-02-23 DIAGNOSIS — Z7984 Long term (current) use of oral hypoglycemic drugs: Secondary | ICD-10-CM | POA: Diagnosis not present

## 2023-02-23 DIAGNOSIS — I10 Essential (primary) hypertension: Secondary | ICD-10-CM | POA: Diagnosis not present

## 2023-02-23 DIAGNOSIS — E669 Obesity, unspecified: Secondary | ICD-10-CM | POA: Diagnosis not present

## 2023-02-23 DIAGNOSIS — K5790 Diverticulosis of intestine, part unspecified, without perforation or abscess without bleeding: Secondary | ICD-10-CM | POA: Diagnosis not present

## 2023-02-23 DIAGNOSIS — M1611 Unilateral primary osteoarthritis, right hip: Secondary | ICD-10-CM | POA: Diagnosis not present

## 2023-02-23 DIAGNOSIS — F419 Anxiety disorder, unspecified: Secondary | ICD-10-CM | POA: Diagnosis not present

## 2023-02-23 DIAGNOSIS — E785 Hyperlipidemia, unspecified: Secondary | ICD-10-CM | POA: Diagnosis not present

## 2023-02-23 DIAGNOSIS — J42 Unspecified chronic bronchitis: Secondary | ICD-10-CM | POA: Diagnosis not present

## 2023-02-23 DIAGNOSIS — G2581 Restless legs syndrome: Secondary | ICD-10-CM | POA: Diagnosis not present

## 2023-02-23 DIAGNOSIS — F325 Major depressive disorder, single episode, in full remission: Secondary | ICD-10-CM | POA: Diagnosis not present

## 2023-02-23 DIAGNOSIS — K760 Fatty (change of) liver, not elsewhere classified: Secondary | ICD-10-CM | POA: Diagnosis not present

## 2023-02-23 DIAGNOSIS — J45909 Unspecified asthma, uncomplicated: Secondary | ICD-10-CM | POA: Diagnosis not present

## 2023-02-27 DIAGNOSIS — M25662 Stiffness of left knee, not elsewhere classified: Secondary | ICD-10-CM | POA: Diagnosis not present

## 2023-02-27 DIAGNOSIS — Z09 Encounter for follow-up examination after completed treatment for conditions other than malignant neoplasm: Secondary | ICD-10-CM | POA: Diagnosis not present

## 2023-02-27 DIAGNOSIS — I10 Essential (primary) hypertension: Secondary | ICD-10-CM | POA: Diagnosis not present

## 2023-02-27 DIAGNOSIS — Z96652 Presence of left artificial knee joint: Secondary | ICD-10-CM | POA: Diagnosis not present

## 2023-02-27 DIAGNOSIS — M25562 Pain in left knee: Secondary | ICD-10-CM | POA: Diagnosis not present

## 2023-02-27 DIAGNOSIS — M6281 Muscle weakness (generalized): Secondary | ICD-10-CM | POA: Diagnosis not present

## 2023-03-02 DIAGNOSIS — Z96652 Presence of left artificial knee joint: Secondary | ICD-10-CM | POA: Diagnosis not present

## 2023-03-06 DIAGNOSIS — M25562 Pain in left knee: Secondary | ICD-10-CM | POA: Diagnosis not present

## 2023-03-06 DIAGNOSIS — Z96652 Presence of left artificial knee joint: Secondary | ICD-10-CM | POA: Diagnosis not present

## 2023-03-08 DIAGNOSIS — Z96652 Presence of left artificial knee joint: Secondary | ICD-10-CM | POA: Diagnosis not present

## 2023-03-12 DIAGNOSIS — E119 Type 2 diabetes mellitus without complications: Secondary | ICD-10-CM | POA: Diagnosis not present

## 2023-03-13 DIAGNOSIS — M25562 Pain in left knee: Secondary | ICD-10-CM | POA: Diagnosis not present

## 2023-03-13 DIAGNOSIS — G4733 Obstructive sleep apnea (adult) (pediatric): Secondary | ICD-10-CM | POA: Diagnosis not present

## 2023-03-13 DIAGNOSIS — Z96652 Presence of left artificial knee joint: Secondary | ICD-10-CM | POA: Diagnosis not present

## 2023-03-14 DIAGNOSIS — G4733 Obstructive sleep apnea (adult) (pediatric): Secondary | ICD-10-CM | POA: Diagnosis not present

## 2023-03-15 DIAGNOSIS — Z96652 Presence of left artificial knee joint: Secondary | ICD-10-CM | POA: Diagnosis not present

## 2023-03-16 DIAGNOSIS — Z471 Aftercare following joint replacement surgery: Secondary | ICD-10-CM | POA: Diagnosis not present

## 2023-03-19 DIAGNOSIS — J4540 Moderate persistent asthma, uncomplicated: Secondary | ICD-10-CM | POA: Diagnosis not present

## 2023-03-19 DIAGNOSIS — F419 Anxiety disorder, unspecified: Secondary | ICD-10-CM | POA: Diagnosis not present

## 2023-03-19 DIAGNOSIS — G2581 Restless legs syndrome: Secondary | ICD-10-CM | POA: Diagnosis not present

## 2023-03-19 DIAGNOSIS — Z0000 Encounter for general adult medical examination without abnormal findings: Secondary | ICD-10-CM | POA: Diagnosis not present

## 2023-03-19 DIAGNOSIS — Z125 Encounter for screening for malignant neoplasm of prostate: Secondary | ICD-10-CM | POA: Diagnosis not present

## 2023-03-19 DIAGNOSIS — E119 Type 2 diabetes mellitus without complications: Secondary | ICD-10-CM | POA: Diagnosis not present

## 2023-03-19 DIAGNOSIS — G4733 Obstructive sleep apnea (adult) (pediatric): Secondary | ICD-10-CM | POA: Diagnosis not present

## 2023-03-19 DIAGNOSIS — I10 Essential (primary) hypertension: Secondary | ICD-10-CM | POA: Diagnosis not present

## 2023-03-19 DIAGNOSIS — Z96652 Presence of left artificial knee joint: Secondary | ICD-10-CM | POA: Diagnosis not present

## 2023-03-19 DIAGNOSIS — M25562 Pain in left knee: Secondary | ICD-10-CM | POA: Diagnosis not present

## 2023-03-21 DIAGNOSIS — M25562 Pain in left knee: Secondary | ICD-10-CM | POA: Diagnosis not present

## 2023-03-21 DIAGNOSIS — Z96652 Presence of left artificial knee joint: Secondary | ICD-10-CM | POA: Diagnosis not present

## 2023-03-27 DIAGNOSIS — Z96652 Presence of left artificial knee joint: Secondary | ICD-10-CM | POA: Diagnosis not present

## 2023-03-29 DIAGNOSIS — M25562 Pain in left knee: Secondary | ICD-10-CM | POA: Diagnosis not present

## 2023-03-29 DIAGNOSIS — Z96652 Presence of left artificial knee joint: Secondary | ICD-10-CM | POA: Diagnosis not present

## 2023-04-03 DIAGNOSIS — Z96652 Presence of left artificial knee joint: Secondary | ICD-10-CM | POA: Diagnosis not present

## 2023-04-04 ENCOUNTER — Encounter: Payer: Self-pay | Admitting: Internal Medicine

## 2023-04-05 DIAGNOSIS — M25562 Pain in left knee: Secondary | ICD-10-CM | POA: Diagnosis not present

## 2023-04-05 DIAGNOSIS — Z96652 Presence of left artificial knee joint: Secondary | ICD-10-CM | POA: Diagnosis not present

## 2023-04-10 ENCOUNTER — Encounter: Payer: Self-pay | Admitting: Internal Medicine

## 2023-04-10 DIAGNOSIS — M25562 Pain in left knee: Secondary | ICD-10-CM | POA: Diagnosis not present

## 2023-04-10 DIAGNOSIS — Z96652 Presence of left artificial knee joint: Secondary | ICD-10-CM | POA: Diagnosis not present

## 2023-04-11 ENCOUNTER — Ambulatory Visit
Admission: RE | Admit: 2023-04-11 | Discharge: 2023-04-11 | Disposition: A | Discharge status: Home / Self Care | Payer: PPO | Attending: Internal Medicine | Admitting: Internal Medicine

## 2023-04-11 ENCOUNTER — Ambulatory Visit: Payer: PPO | Admitting: Anesthesiology

## 2023-04-11 ENCOUNTER — Encounter: Payer: Self-pay | Admitting: Internal Medicine

## 2023-04-11 ENCOUNTER — Encounter
Admission: RE | Disposition: A | Discharge status: Home / Self Care | Payer: Self-pay | Source: Home / Self Care | Attending: Internal Medicine

## 2023-04-11 DIAGNOSIS — K5520 Angiodysplasia of colon without hemorrhage: Secondary | ICD-10-CM | POA: Diagnosis not present

## 2023-04-11 DIAGNOSIS — J45909 Unspecified asthma, uncomplicated: Secondary | ICD-10-CM | POA: Insufficient documentation

## 2023-04-11 DIAGNOSIS — E119 Type 2 diabetes mellitus without complications: Secondary | ICD-10-CM | POA: Insufficient documentation

## 2023-04-11 DIAGNOSIS — Z96652 Presence of left artificial knee joint: Secondary | ICD-10-CM | POA: Insufficient documentation

## 2023-04-11 DIAGNOSIS — G4730 Sleep apnea, unspecified: Secondary | ICD-10-CM | POA: Insufficient documentation

## 2023-04-11 DIAGNOSIS — K640 First degree hemorrhoids: Secondary | ICD-10-CM | POA: Insufficient documentation

## 2023-04-11 DIAGNOSIS — Z7984 Long term (current) use of oral hypoglycemic drugs: Secondary | ICD-10-CM | POA: Insufficient documentation

## 2023-04-11 DIAGNOSIS — K648 Other hemorrhoids: Secondary | ICD-10-CM | POA: Diagnosis not present

## 2023-04-11 DIAGNOSIS — Q2733 Arteriovenous malformation of digestive system vessel: Secondary | ICD-10-CM | POA: Diagnosis not present

## 2023-04-11 DIAGNOSIS — F411 Generalized anxiety disorder: Secondary | ICD-10-CM | POA: Insufficient documentation

## 2023-04-11 DIAGNOSIS — Z7985 Long-term (current) use of injectable non-insulin antidiabetic drugs: Secondary | ICD-10-CM | POA: Insufficient documentation

## 2023-04-11 DIAGNOSIS — Z6832 Body mass index (BMI) 32.0-32.9, adult: Secondary | ICD-10-CM | POA: Insufficient documentation

## 2023-04-11 DIAGNOSIS — Z79899 Other long term (current) drug therapy: Secondary | ICD-10-CM | POA: Diagnosis not present

## 2023-04-11 DIAGNOSIS — Z86010 Personal history of colonic polyps: Secondary | ICD-10-CM | POA: Insufficient documentation

## 2023-04-11 DIAGNOSIS — I10 Essential (primary) hypertension: Secondary | ICD-10-CM | POA: Insufficient documentation

## 2023-04-11 DIAGNOSIS — K5730 Diverticulosis of large intestine without perforation or abscess without bleeding: Secondary | ICD-10-CM | POA: Diagnosis not present

## 2023-04-11 DIAGNOSIS — Z1211 Encounter for screening for malignant neoplasm of colon: Secondary | ICD-10-CM | POA: Insufficient documentation

## 2023-04-11 DIAGNOSIS — G2581 Restless legs syndrome: Secondary | ICD-10-CM | POA: Diagnosis not present

## 2023-04-11 HISTORY — PX: COLONOSCOPY WITH PROPOFOL: SHX5780

## 2023-04-11 LAB — GLUCOSE, CAPILLARY: Glucose-Capillary: 154 mg/dL — ABNORMAL HIGH (ref 70–99)

## 2023-04-11 SURGERY — COLONOSCOPY WITH PROPOFOL
Anesthesia: General

## 2023-04-11 MED ORDER — SODIUM CHLORIDE 0.9 % IV SOLN: INTRAVENOUS | Status: DC

## 2023-04-11 MED ORDER — PROPOFOL 10 MG/ML IV BOLUS
INTRAVENOUS | Status: DC | PRN
  Administered 2023-04-11: 150 ug/kg/min via INTRAVENOUS
  Administered 2023-04-11 (×2): 50 mg via INTRAVENOUS

## 2023-04-11 MED ORDER — LIDOCAINE HCL (CARDIAC) PF 100 MG/5ML IV SOSY
PREFILLED_SYRINGE | INTRAVENOUS | Status: DC | PRN
  Administered 2023-04-11: 100 mg via INTRAVENOUS

## 2023-04-11 NOTE — H&P (Signed)
Outpatient short stay form Pre-procedure 04/11/2023 8:56 AM Alexander Rogers K. Norma Fredrickson, M.D.  Primary Physician: Marcelino Duster, M.D.  Reason for visit:  Personal history of adenomatous colon polyps - 2007.  History of present illness:                            Patient presents for colonoscopy for a personal hx of colon polyps. The patient denies abdominal pain, abnormal weight loss or rectal bleeding.  05/22/2017 Physician: Dr. Lourena Simmonds @ [facility] Polyps Removed: None DIVERTICULOSIS, PH POLYPS 2007.     Current Facility-Administered Medications:    0.9 %  sodium chloride infusion, , Intravenous, Continuous, [name], [name], MD  Medications Prior to Admission  Medication Sig Dispense Refill Last Dose   acetaminophen (TYLENOL) 500 MG tablet Take 2 tablets (1,000 mg total) by mouth every 8 (eight) hours. 30 tablet 0 Past Month   amLODipine (NORVASC) 10 MG tablet Take 10 mg by mouth daily.   04/10/2023   escitalopram (LEXAPRO) 20 MG tablet Take 20 mg by mouth daily.    04/03/2023   loratadine (CLARITIN) 10 MG tablet Take 10 mg by mouth as needed for allergies.   Past Month   losartan (COZAAR) 25 MG tablet Take 25 mg by mouth daily.   04/10/2023   metFORMIN (GLUCOPHAGE-XR) 500 MG 24 hr tablet Take 1,000 mg by mouth daily with breakfast.    04/03/2023   Mometasone Furo & Dimethicone 0.1 & 5 % THPK Apply 1 drop topically as needed. To ears   04/03/2023   montelukast (SINGULAIR) 10 MG tablet Take 10 mg by mouth as needed.   04/03/2023   pramipexole (MIRAPEX) 0.25 MG tablet Take 0.25 mg by mouth daily.    04/03/2023   albuterol (PROVENTIL HFA;VENTOLIN HFA) 108 (90 BASE) MCG/ACT inhaler Inhale 2 puffs into the lungs 4 (four) times daily as needed. (Patient not taking: Reported on 04/04/2023)   Not Taking   docusate sodium (COLACE) 100 MG capsule Take 1 capsule (100 mg total) by mouth 2 (two) times daily. (Patient not taking: Reported on 04/04/2023) 10 capsule 0 Not Taking   enoxaparin (LOVENOX) 40 MG/0.4ML injection  Inject 0.4 mLs (40 mg total) into the skin daily for 14 days. 5.6 mL 0    glucose blood test strip       ondansetron (ZOFRAN) 4 MG tablet Take 1 tablet (4 mg total) by mouth every 6 (six) hours as needed for nausea. (Patient not taking: Reported on 04/04/2023) 20 tablet 0 Not Taking   Semaglutide,0.25 or 0.5MG /DOS, (OZEMPIC, 0.25 OR 0.5 MG/DOSE,) 2 MG/3ML SOPN Inject 0.5 mg into the skin once a week. On Wednesday   03/28/2023   traMADol (ULTRAM) 50 MG tablet Take 1 tablet (50 mg total) by mouth every 6 (six) hours as needed for moderate pain. (Patient not taking: Reported on 04/04/2023) 30 tablet 0 Not Taking     Allergies  Allergen Reactions   Fish Allergy Other (See Comments)    Was told to avoid shell fish; able to eat some fish   Tomato Rash     Past Medical History:  Diagnosis Date   Allergy    Seasonal   Arthritis    Asthma    Bronchitis    Decreased white blood cell count    Diabetes mellitus without complication    Diverticulosis    GAD (generalized anxiety disorder)    History of kidney stones    History of meniscal  tear    History of pneumonia    Hyperlipidemia    Hypertension    Leukopenia    Pneumonia    Restless leg syndrome    Sleep apnea    on CPAP   Thrombocytopenia     Review of systems:  Otherwise negative.    Physical Exam  Gen: Alert, oriented. Appears stated age.  HEENT: [place]/AT. PERRLA. Lungs: CTA, no wheezes. CV: RR nl S1, S2. Abd: soft, benign, no masses. BS+ Ext: No edema. Pulses 2+    Planned procedures: Proceed with colonoscopy. The patient understands the nature of the planned procedure, indications, risks, alternatives and potential complications including but not limited to bleeding, infection, perforation, damage to internal organs and possible oversedation/side effects from anesthesia. The patient agrees and gives consent to proceed.  Please refer to procedure notes for findings, recommendations and patient disposition/instructions.      Donnamae Muilenburg K. Norma Fredrickson, M.D. Gastroenterology 04/11/2023  8:56 AM

## 2023-04-11 NOTE — Anesthesia Postprocedure Evaluation (Signed)
Anesthesia Post Note  Patient: Alexander Rogers  Procedure(s) Performed: COLONOSCOPY WITH PROPOFOL  Patient location during evaluation: PACU Anesthesia Type: General Level of consciousness: awake and oriented Pain management: satisfactory to patient Vital Signs Assessment: post-procedure vital signs reviewed and stable Respiratory status: nonlabored ventilation and spontaneous breathing Cardiovascular status: blood pressure returned to baseline Anesthetic complications: no   There were no known notable events for this encounter.   Last Vitals:  Vitals:   04/11/23 0934 04/11/23 0944  BP: 119/75 125/79  Pulse: 64 60  Resp: (!) 23 (!) 23  Temp:    SpO2: 96% 95%    Last Pain:  Vitals:   04/11/23 0934  TempSrc:   PainSc: 0-No pain                 VAN STAVEREN,Maryjayne Kleven

## 2023-04-11 NOTE — Op Note (Signed)
[facility] Gastroenterology Patient Name: Alexander Rogers Procedure Date: 04/11/2023 8:56 AM MRN: [id number] Account #: [id number] Date of Birth: [birth year] Admit Type: Outpatient Age: 67 Room: [facility] ENDO ROOM 2 Gender: Male Note Status: Finalized Instrument Name: Prentice Docker [id number] Procedure:             Colonoscopy Indications:           High risk colon cancer surveillance: Personal history                         of non-advanced adenoma Providers:             [name]. Norma Fredrickson MD, MD Referring MD:          Gracelyn Nurse, MD (Referring MD) Medicines:             Propofol per Anesthesia Complications:         No immediate complications. Procedure:             Pre-Anesthesia Assessment:                        - The risks and benefits of the procedure and the                         sedation options and risks were discussed with the                         patient. All questions were answered and informed                         consent was obtained.                        - Patient identification and proposed procedure were                         verified prior to the procedure by the nurse. The                         procedure was verified in the pre-procedure area in                         the procedure room.                        After obtaining informed consent, the colonoscope was                         passed under direct vision. Throughout the procedure,                         the patient's blood pressure, pulse, and oxygen                         saturations were monitored continuously. The                         Colonoscope was introduced through the anus and  advanced to the the cecum, identified by appendiceal                         orifice and ileocecal valve. The colonoscopy was                         performed without difficulty. The patient tolerated                         the procedure well. The quality of the  bowel                         preparation was excellent. The ileocecal valve,                         appendiceal orifice, and rectum were photographed. Findings:      The perianal and digital rectal examinations were normal. Pertinent       negatives include normal sphincter tone and no palpable rectal lesions.      Non-bleeding internal hemorrhoids were found during retroflexion. The       hemorrhoids were Grade I (internal hemorrhoids that do not prolapse).      Many small-mouthed diverticula were found in the sigmoid colon and       descending colon. There was no evidence of diverticular bleeding.      A single small localized angioectasia without bleeding was found in the       cecum.      The exam was otherwise without abnormality. Impression:            - Non-bleeding internal hemorrhoids.                        - Mild diverticulosis in the sigmoid colon and in the                         descending colon. There was no evidence of                         diverticular bleeding.                        - A single non-bleeding colonic angioectasia.                        - The examination was otherwise normal.                        - No specimens collected. Recommendation:        - Patient has a contact number available for                         emergencies. The signs and symptoms of potential                         delayed complications were discussed with the patient.                         Return to normal activities tomorrow. Written  discharge instructions were provided to the patient.                        - Resume previous diet.                        - Continue present medications.                        - Repeat colonoscopy in 10 years for screening                         purposes.                        - Return to GI office PRN.                        - The findings and recommendations were discussed with                         the  patient. Procedure Code(s):     --- Professional ---                        J4782, Colorectal cancer screening; colonoscopy on                         individual at high risk Diagnosis Code(s):     --- Professional ---                        K57.30, Diverticulosis of large intestine without                         perforation or abscess without bleeding                        K55.20, Angiodysplasia of colon without hemorrhage                        K64.0, First degree hemorrhoids                        Z86.010, Personal history of colonic polyps CPT copyright 2022 American Medical Association. All rights reserved. The codes documented in this report are preliminary and upon coder review may  be revised to meet current compliance requirements. Stanton Kidney MD, MD 04/11/2023 9:24:03 AM This report has been signed electronically. Number of Addenda: 0 Note Initiated On: 04/11/2023 8:56 AM Scope Withdrawal Time: 0 hours 5 minutes 39 seconds  Total Procedure Duration: 0 hours 8 minutes 8 seconds  Estimated Blood Loss:  Estimated blood loss: none. Estimated blood loss: none.      [facility]

## 2023-04-11 NOTE — Interval H&P Note (Signed)
History and Physical Interval Note:  04/11/2023 8:57 AM  Alexander Rogers  has presented today for surgery, with the diagnosis of personal history colon polyps.  The various methods of treatment have been discussed with the patient and family. After consideration of risks, benefits and other options for treatment, the patient has consented to  Procedure(s) with comments: COLONOSCOPY WITH PROPOFOL (N/A) - DM as a surgical intervention.  The patient's history has been reviewed, patient examined, no change in status, stable for surgery.  I have reviewed the patient's chart and labs.  Questions were answered to the patient's satisfaction.     [place], [place]

## 2023-04-11 NOTE — Transfer of Care (Signed)
Immediate Anesthesia Transfer of Care Note  Patient: Alexander Rogers  Procedure(s) Performed: COLONOSCOPY WITH PROPOFOL  Patient Location: PACU  Anesthesia Type:General  Level of Consciousness: awake, alert , and oriented  Airway & Oxygen Therapy: Patient Spontanous Breathing and Patient connected to nasal cannula oxygen  Post-op Assessment: Report given to RN and Post -op Vital signs reviewed and stable  Post vital signs: stable  Last Vitals:  Vitals Value Taken Time  BP 127/78 04/11/23 0924  Temp 35.9 C 04/11/23 0924  Pulse 72 04/11/23 0924  Resp 24 04/11/23 0924  SpO2 94 % 04/11/23 0924    Last Pain:  Vitals:   04/11/23 0924  TempSrc: Temporal  PainSc: Asleep         Complications: No notable events documented.

## 2023-04-11 NOTE — Anesthesia Preprocedure Evaluation (Signed)
Anesthesia Evaluation  Patient identified by MRN, date of birth, ID band Patient awake    Reviewed: Allergy & Precautions, NPO status , Patient's Chart, lab work & pertinent test results  Airway Mallampati: III  TM Distance: <3 FB Neck ROM: Full    Dental  (+) Teeth Intact   Pulmonary neg pulmonary ROS, asthma , sleep apnea and Continuous Positive Airway Pressure Ventilation    Pulmonary exam normal  + decreased breath sounds      Cardiovascular Exercise Tolerance: Good hypertension, Pt. on medications negative cardio ROS Normal cardiovascular exam Rhythm:Regular Rate:Normal     Neuro/Psych   Anxiety     negative neurological ROS  negative psych ROS   GI/Hepatic negative GI ROS, Neg liver ROS,,,  Endo/Other  negative endocrine ROSdiabetes, Type 2, Oral Hypoglycemic Agents  Morbid obesity  Renal/GU negative Renal ROS  negative genitourinary   Musculoskeletal   Abdominal  (+) + obese  Peds negative pediatric ROS (+)  Hematology negative hematology ROS (+)   Anesthesia Other Findings Past Medical History: No date: Allergy     Comment:  Seasonal No date: Arthritis No date: Asthma No date: Bronchitis No date: Decreased white blood cell count No date: Diabetes mellitus without complication No date: Diverticulosis No date: GAD (generalized anxiety disorder) No date: History of kidney stones No date: History of meniscal tear No date: History of pneumonia No date: Hyperlipidemia No date: Hypertension No date: Leukopenia No date: Pneumonia No date: Restless leg syndrome No date: Sleep apnea     Comment:  on CPAP No date: Thrombocytopenia  Past Surgical History: 06/29/2011: COLONOSCOPY 05/22/2017: COLONOSCOPY WITH PROPOFOL; N/A     Comment:  Procedure: COLONOSCOPY WITH PROPOFOL;  Surgeon:               Christena Deem, MD;  Location: [facility] ENDOSCOPY;                Service: Endoscopy;  Laterality: N/A; No  date: ELBOW ARTHROSCOPY WITH TENDON RECONSTRUCTION; Right No date: EXCISIONAL HEMORRHOIDECTOMY No date: HERNIA REPAIR No date: MENISCUS REPAIR; Left     Comment:  Knee surgery for meniscus tear left side No date: TONSILLECTOMY 02/12/2023: TOTAL KNEE ARTHROPLASTY; Left     Comment:  Procedure: TOTAL KNEE ARTHROPLASTY;  Surgeon: Reinaldo Berber, MD;  Location: [facility] ORS;  Service: Orthopedics;               Laterality: Left; 1989: VASECTOMY  BMI    Body Mass Index: 32.02 kg/m      Reproductive/Obstetrics negative OB ROS                             Anesthesia Physical Anesthesia Plan  ASA: 3  Anesthesia Plan: General   Post-op Pain Management:    Induction: Intravenous  PONV Risk Score and Plan: Propofol infusion and TIVA  Airway Management Planned: Natural Airway  Additional Equipment:   Intra-op Plan:   Post-operative Plan:   Informed Consent: I have reviewed the patients History and Physical, chart, labs and discussed the procedure including the risks, benefits and alternatives for the proposed anesthesia with the patient or authorized representative who has indicated his/her understanding and acceptance.     Dental Advisory Given  Plan Discussed with: CRNA and Surgeon  Anesthesia Plan Comments:        Anesthesia Quick Evaluation

## 2023-04-12 ENCOUNTER — Encounter: Payer: Self-pay | Admitting: Internal Medicine

## 2023-04-12 DIAGNOSIS — M25562 Pain in left knee: Secondary | ICD-10-CM | POA: Diagnosis not present

## 2023-04-12 DIAGNOSIS — Z96652 Presence of left artificial knee joint: Secondary | ICD-10-CM | POA: Diagnosis not present

## 2023-04-14 DIAGNOSIS — G4733 Obstructive sleep apnea (adult) (pediatric): Secondary | ICD-10-CM | POA: Diagnosis not present

## 2023-05-14 DIAGNOSIS — G4733 Obstructive sleep apnea (adult) (pediatric): Secondary | ICD-10-CM | POA: Diagnosis not present

## 2023-05-29 DIAGNOSIS — Z96652 Presence of left artificial knee joint: Secondary | ICD-10-CM | POA: Diagnosis not present

## 2023-06-08 DIAGNOSIS — M1711 Unilateral primary osteoarthritis, right knee: Secondary | ICD-10-CM | POA: Diagnosis not present

## 2023-06-08 DIAGNOSIS — M25561 Pain in right knee: Secondary | ICD-10-CM | POA: Diagnosis not present

## 2023-06-13 DIAGNOSIS — G4733 Obstructive sleep apnea (adult) (pediatric): Secondary | ICD-10-CM | POA: Diagnosis not present

## 2023-07-13 DIAGNOSIS — G4733 Obstructive sleep apnea (adult) (pediatric): Secondary | ICD-10-CM | POA: Diagnosis not present

## 2023-07-17 ENCOUNTER — Other Ambulatory Visit: Payer: Self-pay | Admitting: Orthopedic Surgery

## 2023-07-19 DIAGNOSIS — H5213 Myopia, bilateral: Secondary | ICD-10-CM | POA: Diagnosis not present

## 2023-07-19 DIAGNOSIS — H524 Presbyopia: Secondary | ICD-10-CM | POA: Diagnosis not present

## 2023-07-19 DIAGNOSIS — H52213 Irregular astigmatism, bilateral: Secondary | ICD-10-CM | POA: Diagnosis not present

## 2023-07-19 DIAGNOSIS — E119 Type 2 diabetes mellitus without complications: Secondary | ICD-10-CM | POA: Diagnosis not present

## 2023-07-24 ENCOUNTER — Other Ambulatory Visit: Payer: Self-pay

## 2023-07-24 ENCOUNTER — Encounter
Admission: RE | Admit: 2023-07-24 | Discharge: 2023-07-24 | Disposition: A | Discharge status: Home / Self Care | Payer: PPO | Source: Ambulatory Visit | Attending: Orthopedic Surgery | Admitting: Orthopedic Surgery

## 2023-07-24 VITALS — BP 127/74 | HR 67 | Resp 16 | Ht 70.0 in | Wt 230.0 lb

## 2023-07-24 DIAGNOSIS — E119 Type 2 diabetes mellitus without complications: Secondary | ICD-10-CM | POA: Insufficient documentation

## 2023-07-24 DIAGNOSIS — Z01818 Encounter for other preprocedural examination: Secondary | ICD-10-CM | POA: Diagnosis not present

## 2023-07-24 DIAGNOSIS — Z01810 Encounter for preprocedural cardiovascular examination: Secondary | ICD-10-CM | POA: Diagnosis not present

## 2023-07-24 DIAGNOSIS — M1711 Unilateral primary osteoarthritis, right knee: Secondary | ICD-10-CM | POA: Diagnosis not present

## 2023-07-24 HISTORY — DX: COVID-19: U07.1

## 2023-07-24 LAB — COMPREHENSIVE METABOLIC PANEL
ALT: 33 U/L (ref 0–44)
AST: 27 U/L (ref 15–41)
Albumin: 4.1 g/dL (ref 3.5–5.0)
Alkaline Phosphatase: 59 U/L (ref 38–126)
Anion gap: 9 (ref 5–15)
BUN: 18 mg/dL (ref 8–23)
CO2: 21 mmol/L — ABNORMAL LOW (ref 22–32)
Calcium: 9.2 mg/dL (ref 8.9–10.3)
Chloride: 104 mmol/L (ref 98–111)
Creatinine, Ser: 0.86 mg/dL (ref 0.61–1.24)
GFR, Estimated: >60 mL/min (ref >60)
Glucose, Bld: 193 mg/dL — ABNORMAL HIGH (ref 70–99)
Potassium: 3.5 mmol/L (ref 3.5–5.1)
Sodium: 134 mmol/L — ABNORMAL LOW (ref 135–145)
Total Bilirubin: 0.6 mg/dL (ref 0.3–1.2)
Total Protein: 7.5 g/dL (ref 6.5–8.1)

## 2023-07-24 LAB — CBC WITH DIFFERENTIAL/PLATELET
Abs Immature Granulocytes: 0.02 10*3/uL (ref 0.00–0.07)
Basophils Absolute: 0 10*3/uL (ref 0.0–0.1)
Basophils Relative: 1 %
Eosinophils Absolute: 0.2 10*3/uL (ref 0.0–0.5)
Eosinophils Relative: 6 %
HCT: 40.1 % (ref 39.0–52.0)
Hemoglobin: 13.6 g/dL (ref 13.0–17.0)
Immature Granulocytes: 1 %
Lymphocytes Relative: 22 %
Lymphs Abs: 0.7 10*3/uL (ref 0.7–4.0)
MCH: 29.1 pg (ref 26.0–34.0)
MCHC: 33.9 g/dL (ref 30.0–36.0)
MCV: 85.7 fL (ref 80.0–100.0)
Monocytes Absolute: 0.5 10*3/uL (ref 0.1–1.0)
Monocytes Relative: 15 %
Neutro Abs: 1.9 10*3/uL (ref 1.7–7.7)
Neutrophils Relative %: 55 %
Platelets: 160 10*3/uL (ref 150–400)
RBC: 4.68 MIL/uL (ref 4.22–5.81)
RDW: 13.5 % (ref 11.5–15.5)
WBC: 3.4 10*3/uL — ABNORMAL LOW (ref 4.0–10.5)
nRBC: 0 % (ref 0.0–0.2)

## 2023-07-24 LAB — URINALYSIS, ROUTINE W REFLEX MICROSCOPIC
Bacteria, UA: NONE SEEN
Bilirubin Urine: NEGATIVE
Glucose, UA: NEGATIVE mg/dL
Hgb urine dipstick: NEGATIVE
Ketones, ur: NEGATIVE mg/dL
Leukocytes,Ua: NEGATIVE
Nitrite: NEGATIVE
Protein, ur: 30 mg/dL — AB
Specific Gravity, Urine: 1.021 (ref 1.005–1.030)
Squamous Epithelial / HPF: NONE SEEN /HPF (ref 0–5)
pH: 5 (ref 5.0–8.0)

## 2023-07-24 LAB — TYPE AND SCREEN
ABO/RH(D): A POS
Antibody Screen: NEGATIVE

## 2023-07-24 LAB — SURGICAL PCR SCREEN
MRSA, PCR: NEGATIVE
Staphylococcus aureus: POSITIVE — AB

## 2023-07-24 NOTE — Patient Instructions (Signed)
Your procedure is scheduled on: Monday 08/06/23 To find out your arrival time, please call [phone] between 1PM - 3PM on:   Friday 08/03/23 Report to the Registration Desk on the 1st floor of the [facility]. Free Valet parking is available.  If your arrival time is 6:00 am, do not arrive before that time as the [facility] entrance doors do not open until 6:00 am.  REMEMBER: Instructions that are not followed completely may result in serious medical risk, up to and including death; or upon the discretion of your surgeon and anesthesiologist your surgery may need to be rescheduled.  Do not eat food after midnight the night before surgery.  No gum chewing or hard candies.  You may however, drink CLEAR liquids up to 2 hours before you are scheduled to arrive for your surgery. Do not drink anything within 2 hours of your scheduled arrival time.  Clear liquids include: - water  Type 1 and Type 2 diabetics should only drink water.  In addition, your doctor has ordered for you to drink the provided:   Gatorade G2 Drinking this carbohydrate drink up to two hours before surgery helps to reduce insulin resistance and improve patient outcomes. Please complete drinking 2 hours before scheduled arrival time.  One week prior to surgery: Stop Anti-inflammatories (NSAIDS) such as Advil, Aleve, Ibuprofen, Motrin, Naproxen, Naprosyn and Aspirin based products such as Excedrin, Goody's Powder, BC Powder. Stop your Melixicam on Monday 07/30/23 You may however, continue to take Tylenol if needed for pain up until the day of surgery.  Stop ANY OVER THE COUNTER supplements and vitamins until after surgery.  Continue taking all prescribed medications with the exception of the following: Ozempic, last dose will be Wednesday 07/25/23. Metformin, last dose will be Friday 08/03/23. Stop your Melixicam on Monday 07/30/23  TAKE ONLY THESE MEDICATIONS THE MORNING OF SURGERY WITH A SIP OF WATER:  none  No Alcohol  for 24 hours before or after surgery.  No Smoking including e-cigarettes for 24 hours before surgery.  No chewable tobacco products for at least 6 hours before surgery.  No nicotine patches on the day of surgery.  Do not use any "recreational" drugs for at least a week (preferably 2 weeks) before your surgery.  Please be advised that the combination of cocaine and anesthesia may have negative outcomes, up to and including death. If you test positive for cocaine, your surgery will be cancelled.  On the morning of surgery brush your teeth with toothpaste and water, you may rinse your mouth with mouthwash if you wish. Do not swallow any toothpaste or mouthwash.  Use CHG Soap or wipes as directed on instruction sheet. Shower daily for 5 days including the day of surgery with this soap.  Do not wear lotions, powders, or perfumes on the day of your surgery.   Do not shave body hair from the neck down 48 hours before surgery.  Wear comfortable clothing (specific to your surgery type) to the hospital.  Do not wear jewelry, make-up, hairpins, clips or nail polish.  Contact lenses, hearing aids and dentures may not be worn into surgery. Glasses bring a case  Bring your C-PAP to the hospital in case you may have to spend the night.   Do not bring valuables to the hospital. [facility] is not responsible for any missing/lost belongings or valuables.   Notify your doctor if there is any change in your medical condition (cold, fever, infection).  If you are being  discharged the day of surgery, you will not be allowed to drive home. You will need a responsible individual to drive you home and stay with you for 24 hours after surgery.   If you are taking public transportation, you will need to have a responsible individual with you.  If you are being admitted to the hospital overnight, leave your suitcase in the car. After surgery it may be brought to your room.  In case of increased patient  census, it may be necessary for you, the patient, to continue your postoperative care in the Same Day Surgery department.  After surgery, you can help prevent lung complications by doing breathing exercises.  Take deep breaths and cough every 1-2 hours. Your doctor may order a device called an Incentive Spirometer to help you take deep breaths. When coughing or sneezing, hold a pillow firmly against your incision with both hands. This is called "splinting." Doing this helps protect your incision. It also decreases belly discomfort.  Surgery Visitation Policy:  Patients undergoing a surgery or procedure may have two family members or support persons with them as long as the person is not COVID-19 positive or experiencing its symptoms.   Inpatient Visitation:    Visiting hours are 7 a.m. to 8 p.m. Up to four visitors are allowed at one time in a patient room. The visitors may rotate out with other people during the day. One designated support person (adult) may remain overnight.  Please call the Pre-admissions Testing Dept. at [phone] if you have any questions about these instructions.    Pre-operative 5 CHG Bath Instructions   You can play a key role in reducing the risk of infection after surgery. Your skin needs to be as free of germs as possible. You can reduce the number of germs on your skin by washing with CHG (chlorhexidine gluconate) soap before surgery. CHG is an antiseptic soap that kills germs and continues to kill germs even after washing.   DO NOT use if you have an allergy to chlorhexidine/CHG or antibacterial soaps. If your skin becomes reddened or irritated, stop using the CHG and notify one of our RNs at [phone].   Please shower with the CHG soap starting 4 days before surgery using the following schedule:     Please keep in mind the following:  DO NOT shave, including legs and underarms, starting the day of your first shower.   You may shave your face at  any point before/day of surgery.  Place clean sheets on your bed the day you start using CHG soap. Use a clean washcloth (not used since being washed) for each shower. DO NOT sleep with pets once you start using the CHG.   CHG Shower Instructions:  If you choose to wash your hair and private area, wash first with your normal shampoo/soap.  After you use shampoo/soap, rinse your hair and body thoroughly to remove shampoo/soap residue.  Turn the water OFF and apply about 3 tablespoons (45 ml) of CHG soap to a CLEAN washcloth.  Apply CHG soap ONLY FROM YOUR NECK DOWN TO YOUR TOES (washing for 3-5 minutes)  DO NOT use CHG soap on face, private areas, open wounds, or sores.  Pay special attention to the area where your surgery is being performed.  If you are having back surgery, having someone wash your back for you may be helpful. Wait 2 minutes after CHG soap is applied, then you may rinse off the CHG soap.  Pat dry  with a clean towel  Put on clean clothes/pajamas   If you choose to wear lotion, please use ONLY the CHG-compatible lotions on the back of this paper.     Additional instructions for the day of surgery: DO NOT APPLY any lotions, deodorants, cologne, or perfumes.   Put on clean/comfortable clothes.  Brush your teeth.  Ask your nurse before applying any prescription medications to the skin.      CHG Compatible Lotions   Aveeno Moisturizing lotion  Cetaphil Moisturizing Cream  Cetaphil Moisturizing Lotion  Clairol Herbal Essence Moisturizing Lotion, Dry Skin  Clairol Herbal Essence Moisturizing Lotion, Extra Dry Skin  Clairol Herbal Essence Moisturizing Lotion, Normal Skin  Curel Age Defying Therapeutic Moisturizing Lotion with Alpha Hydroxy  Curel Extreme Care Body Lotion  Curel Soothing Hands Moisturizing Hand Lotion  Curel Therapeutic Moisturizing Cream, Fragrance-Free  Curel Therapeutic Moisturizing Lotion, Fragrance-Free  Curel Therapeutic Moisturizing Lotion,  Original Formula  Eucerin Daily Replenishing Lotion  Eucerin Dry Skin Therapy Plus Alpha Hydroxy Crme  Eucerin Dry Skin Therapy Plus Alpha Hydroxy Lotion  Eucerin Original Crme  Eucerin Original Lotion  Eucerin Plus Crme Eucerin Plus Lotion  Eucerin TriLipid Replenishing Lotion  Keri Anti-Bacterial Hand Lotion  Keri Deep Conditioning Original Lotion Dry Skin Formula Softly Scented  Keri Deep Conditioning Original Lotion, Fragrance Free Sensitive Skin Formula  Keri Lotion Fast Absorbing Fragrance Free Sensitive Skin Formula  Keri Lotion Fast Absorbing Softly Scented Dry Skin Formula  Keri Original Lotion  Keri Skin Renewal Lotion Keri Silky Smooth Lotion  Keri Silky Smooth Sensitive Skin Lotion  Nivea Body Creamy Conditioning Oil  Nivea Body Extra Enriched Lotion  Nivea Body Original Lotion  Nivea Body Sheer Moisturizing Lotion Nivea Crme  Nivea Skin Firming Lotion  NutraDerm 30 Skin Lotion  NutraDerm Skin Lotion  NutraDerm Therapeutic Skin Cream  NutraDerm Therapeutic Skin Lotion  ProShield Protective Hand Cream  Provon moisturizing lotion  How to Use an Incentive Spirometer  An incentive spirometer is a tool that measures how well you are filling your lungs with each breath. Learning to take long, deep breaths using this tool can help you keep your lungs clear and active. This may help to reverse or lessen your chance of developing breathing (pulmonary) problems, especially infection. You may be asked to use a spirometer: After a surgery. If you have a lung problem or a history of smoking. After a long period of time when you have been unable to move or be active. If the spirometer includes an indicator to show the highest number that you have reached, your health care provider or respiratory therapist will help you set a goal. Keep a log of your progress as told by your health care provider. What are the risks? Breathing too quickly may cause dizziness or cause you to pass  out. Take your time so you do not get dizzy or light-headed. If you are in pain, you may need to take pain medicine before doing incentive spirometry. It is harder to take a deep breath if you are having pain. How to use your incentive spirometer  Sit up on the edge of your bed or on a chair. Hold the incentive spirometer so that it is in an upright position. Before you use the spirometer, breathe out normally. Place the mouthpiece in your mouth. Make sure your lips are closed tightly around it. Breathe in slowly and as deeply as you can through your mouth, causing the piston or the ball to rise toward the  top of the chamber. Hold your breath for 3-5 seconds, or for as long as possible. If the spirometer includes a coach indicator, use this to guide you in breathing. Slow down your breathing if the indicator goes above the marked areas. Remove the mouthpiece from your mouth and breathe out normally. The piston or ball will return to the bottom of the chamber. Rest for a few seconds, then repeat the steps 10 or more times. Take your time and take a few normal breaths between deep breaths so that you do not get dizzy or light-headed. Do this every 1-2 hours when you are awake. If the spirometer includes a goal marker to show the highest number you have reached (best effort), use this as a goal to work toward during each repetition. After each set of 10 deep breaths, cough a few times. This will help to make sure that your lungs are clear. If you have an incision on your chest or abdomen from surgery, place a pillow or a rolled-up towel firmly against the incision when you cough. This can help to reduce pain while taking deep breaths and coughing. General tips When you are able to get out of bed: Walk around often. Continue to take deep breaths and cough in order to clear your lungs. Keep using the incentive spirometer until your health care provider says it is okay to stop using it. If you have  been in the hospital, you may be told to keep using the spirometer at home. Contact a health care provider if: You are having difficulty using the spirometer. You have trouble using the spirometer as often as instructed. Your pain medicine is not giving enough relief for you to use the spirometer as told. You have a fever. Get help right away if: You develop shortness of breath. You develop a cough with bloody mucus from the lungs. You have fluid or blood coming from an incision site after you cough. Summary An incentive spirometer is a tool that can help you learn to take long, deep breaths to keep your lungs clear and active. You may be asked to use a spirometer after a surgery, if you have a lung problem or a history of smoking, or if you have been inactive for a long period of time. Use your incentive spirometer as instructed every 1-2 hours while you are awake. If you have an incision on your chest or abdomen, place a pillow or a rolled-up towel firmly against your incision when you cough. This will help to reduce pain. Get help right away if you have shortness of breath, you cough up bloody mucus, or blood comes from your incision when you cough. This information is not intended to replace advice given to you by your health care provider. Make sure you discuss any questions you have with your health care provider. Document Revised: 03/01/2020 Document Reviewed: 03/01/2020 Elsevier Patient Education  2023 Elsevier Inc.    Preoperative Educational Videos for Total Hip, Knee and Shoulder Replacements  To better prepare for surgery, please view our videos that explain the physical activity and discharge planning required to have the best surgical recovery at [facility].  [email or web address]  Questions? Call [phone] or email [email or web address]

## 2023-08-06 ENCOUNTER — Ambulatory Visit: Payer: PPO | Admitting: Urgent Care

## 2023-08-06 ENCOUNTER — Observation Stay
Admission: RE | Admit: 2023-08-06 | Discharge: 2023-08-07 | Disposition: A | Discharge status: Home Health | Payer: PPO | Attending: Orthopedic Surgery | Admitting: Orthopedic Surgery

## 2023-08-06 ENCOUNTER — Encounter
Admission: RE | Disposition: A | Discharge status: Home Health | Payer: Self-pay | Source: Home / Self Care | Attending: Orthopedic Surgery

## 2023-08-06 ENCOUNTER — Other Ambulatory Visit: Payer: Self-pay

## 2023-08-06 ENCOUNTER — Ambulatory Visit: Payer: PPO | Admitting: Anesthesiology

## 2023-08-06 ENCOUNTER — Ambulatory Visit: Payer: PPO

## 2023-08-06 ENCOUNTER — Encounter: Payer: Self-pay | Admitting: Orthopedic Surgery

## 2023-08-06 DIAGNOSIS — E119 Type 2 diabetes mellitus without complications: Secondary | ICD-10-CM | POA: Insufficient documentation

## 2023-08-06 DIAGNOSIS — Z8616 Personal history of COVID-19: Secondary | ICD-10-CM | POA: Diagnosis not present

## 2023-08-06 DIAGNOSIS — Z96659 Presence of unspecified artificial knee joint: Secondary | ICD-10-CM | POA: Diagnosis not present

## 2023-08-06 DIAGNOSIS — I10 Essential (primary) hypertension: Secondary | ICD-10-CM | POA: Diagnosis not present

## 2023-08-06 DIAGNOSIS — M1711 Unilateral primary osteoarthritis, right knee: Secondary | ICD-10-CM | POA: Diagnosis not present

## 2023-08-06 DIAGNOSIS — Z79899 Other long term (current) drug therapy: Secondary | ICD-10-CM | POA: Insufficient documentation

## 2023-08-06 DIAGNOSIS — Z471 Aftercare following joint replacement surgery: Secondary | ICD-10-CM | POA: Diagnosis not present

## 2023-08-06 DIAGNOSIS — Z96651 Presence of right artificial knee joint: Secondary | ICD-10-CM | POA: Diagnosis not present

## 2023-08-06 DIAGNOSIS — Z7984 Long term (current) use of oral hypoglycemic drugs: Secondary | ICD-10-CM | POA: Diagnosis not present

## 2023-08-06 DIAGNOSIS — J45909 Unspecified asthma, uncomplicated: Secondary | ICD-10-CM | POA: Diagnosis not present

## 2023-08-06 HISTORY — PX: TOTAL KNEE ARTHROPLASTY: SHX125

## 2023-08-06 LAB — GLUCOSE, CAPILLARY
Glucose-Capillary: 166 mg/dL — ABNORMAL HIGH (ref 70–99)
Glucose-Capillary: 206 mg/dL — ABNORMAL HIGH (ref 70–99)
Glucose-Capillary: 247 mg/dL — ABNORMAL HIGH (ref 70–99)
Glucose-Capillary: 316 mg/dL — ABNORMAL HIGH (ref 70–99)
Glucose-Capillary: 331 mg/dL — ABNORMAL HIGH (ref 70–99)

## 2023-08-06 SURGERY — ARTHROPLASTY, KNEE, TOTAL
Anesthesia: Spinal | Site: Knee | Laterality: Right

## 2023-08-06 MED ORDER — TRAMADOL HCL 50 MG PO TABS
ORAL_TABLET | ORAL | Status: AC
  Filled 2023-08-06: qty 1

## 2023-08-06 MED ORDER — DROPERIDOL 2.5 MG/ML IJ SOLN: 0.6250 mg | Freq: Once | INTRAMUSCULAR | Status: DC | PRN

## 2023-08-06 MED ORDER — SODIUM CHLORIDE 0.9 % IV SOLN: INTRAVENOUS | Status: DC

## 2023-08-06 MED ORDER — MIDAZOLAM HCL 5 MG/5ML IJ SOLN
INTRAMUSCULAR | Status: DC | PRN
  Administered 2023-08-06: 2 mg via INTRAVENOUS

## 2023-08-06 MED ORDER — ORAL CARE MOUTH RINSE: 15.0000 mL | Freq: Once | OROMUCOSAL | Status: AC

## 2023-08-06 MED ORDER — PROPOFOL 1000 MG/100ML IV EMUL
INTRAVENOUS | Status: AC
  Filled 2023-08-06: qty 100

## 2023-08-06 MED ORDER — KETAMINE HCL 50 MG/5ML IJ SOSY
PREFILLED_SYRINGE | INTRAMUSCULAR | Status: AC
  Filled 2023-08-06: qty 5

## 2023-08-06 MED ORDER — GLYCOPYRROLATE 0.2 MG/ML IJ SOLN
INTRAMUSCULAR | Status: DC | PRN
  Administered 2023-08-06: .2 mg via INTRAVENOUS

## 2023-08-06 MED ORDER — OXYCODONE HCL 5 MG/5ML PO SOLN: 5.0000 mg | Freq: Once | ORAL | Status: AC | PRN

## 2023-08-06 MED ORDER — CHLORHEXIDINE GLUCONATE 0.12 % MT SOLN
OROMUCOSAL | Status: AC
  Filled 2023-08-06: qty 15

## 2023-08-06 MED ORDER — OXYCODONE HCL 5 MG PO TABS
5.0000 mg | ORAL_TABLET | Freq: Once | ORAL | Status: AC | PRN
  Administered 2023-08-06: 5 mg via ORAL

## 2023-08-06 MED ORDER — BUPIVACAINE LIPOSOME 1.3 % IJ SUSP
INTRAMUSCULAR | Status: AC
  Filled 2023-08-06: qty 20

## 2023-08-06 MED ORDER — CHLORHEXIDINE GLUCONATE 0.12 % MT SOLN
15.0000 mL | Freq: Once | OROMUCOSAL | Status: AC
  Administered 2023-08-06: 15 mL via OROMUCOSAL

## 2023-08-06 MED ORDER — TRANEXAMIC ACID-NACL 1000-0.7 MG/100ML-% IV SOLN
INTRAVENOUS | Status: AC
  Filled 2023-08-06: qty 100

## 2023-08-06 MED ORDER — KETOROLAC TROMETHAMINE 15 MG/ML IJ SOLN
INTRAMUSCULAR | Status: AC
  Filled 2023-08-06: qty 1

## 2023-08-06 MED ORDER — CEFAZOLIN SODIUM-DEXTROSE 2-4 GM/100ML-% IV SOLN
2.0000 g | Freq: Four times a day (QID) | INTRAVENOUS | Status: AC
  Administered 2023-08-06 (×2): 2 g via INTRAVENOUS

## 2023-08-06 MED ORDER — INSULIN ASPART 100 UNIT/ML IJ SOLN
0.0000 [IU] | Freq: Three times a day (TID) | INTRAMUSCULAR | Status: DC
  Administered 2023-08-06: 5 [IU] via SUBCUTANEOUS
  Administered 2023-08-06: 11 [IU] via SUBCUTANEOUS
  Administered 2023-08-07: 5 [IU] via SUBCUTANEOUS

## 2023-08-06 MED ORDER — METOCLOPRAMIDE HCL 5 MG/ML IJ SOLN: 5.0000 mg | Freq: Three times a day (TID) | INTRAMUSCULAR | Status: DC | PRN

## 2023-08-06 MED ORDER — BUPIVACAINE HCL (PF) 0.5 % IJ SOLN
INTRAMUSCULAR | Status: DC | PRN
  Administered 2023-08-06: 2.6 mL

## 2023-08-06 MED ORDER — INSULIN ASPART 100 UNIT/ML IJ SOLN
INTRAMUSCULAR | Status: AC
  Filled 2023-08-06: qty 1

## 2023-08-06 MED ORDER — DEXAMETHASONE SODIUM PHOSPHATE 10 MG/ML IJ SOLN
8.0000 mg | Freq: Once | INTRAMUSCULAR | Status: AC
  Administered 2023-08-06: 8 mg via INTRAVENOUS

## 2023-08-06 MED ORDER — HYDROCODONE-ACETAMINOPHEN 5-325 MG PO TABS
1.0000 | ORAL_TABLET | ORAL | Status: DC | PRN
  Administered 2023-08-06 (×2): 2 via ORAL

## 2023-08-06 MED ORDER — LOSARTAN POTASSIUM 50 MG PO TABS
50.0000 mg | ORAL_TABLET | Freq: Every day | ORAL | Status: DC
  Administered 2023-08-06 – 2023-08-07 (×2): 50 mg via ORAL

## 2023-08-06 MED ORDER — ORAL CARE MOUTH RINSE: 15.0000 mL | OROMUCOSAL | Status: DC | PRN

## 2023-08-06 MED ORDER — PROPOFOL 500 MG/50ML IV EMUL
INTRAVENOUS | Status: DC | PRN
  Administered 2023-08-06: 100 ug/kg/min via INTRAVENOUS

## 2023-08-06 MED ORDER — METOCLOPRAMIDE HCL 5 MG PO TABS: 5.0000 mg | ORAL_TABLET | Freq: Three times a day (TID) | ORAL | Status: DC | PRN

## 2023-08-06 MED ORDER — PANTOPRAZOLE SODIUM 40 MG PO TBEC
DELAYED_RELEASE_TABLET | ORAL | Status: AC
  Filled 2023-08-06: qty 1

## 2023-08-06 MED ORDER — HYDROCODONE-ACETAMINOPHEN 5-325 MG PO TABS
ORAL_TABLET | ORAL | Status: AC
  Filled 2023-08-06: qty 2

## 2023-08-06 MED ORDER — SODIUM CHLORIDE FLUSH 0.9 % IV SOLN
INTRAVENOUS | Status: AC
  Filled 2023-08-06: qty 20

## 2023-08-06 MED ORDER — ACETAMINOPHEN 500 MG PO TABS
1000.0000 mg | ORAL_TABLET | Freq: Three times a day (TID) | ORAL | Status: DC
  Administered 2023-08-07: 1000 mg via ORAL

## 2023-08-06 MED ORDER — CEFAZOLIN SODIUM-DEXTROSE 2-4 GM/100ML-% IV SOLN
INTRAVENOUS | Status: AC
  Filled 2023-08-06: qty 100

## 2023-08-06 MED ORDER — AMLODIPINE BESYLATE 5 MG PO TABS
ORAL_TABLET | ORAL | Status: AC
  Filled 2023-08-06: qty 1

## 2023-08-06 MED ORDER — FENTANYL CITRATE (PF) 100 MCG/2ML IJ SOLN
INTRAMUSCULAR | Status: AC
  Filled 2023-08-06: qty 2

## 2023-08-06 MED ORDER — ONDANSETRON HCL 4 MG/2ML IJ SOLN
INTRAMUSCULAR | Status: DC | PRN
  Administered 2023-08-06: 4 mg via INTRAVENOUS

## 2023-08-06 MED ORDER — TRAMADOL HCL 50 MG PO TABS
50.0000 mg | ORAL_TABLET | Freq: Four times a day (QID) | ORAL | Status: DC | PRN
  Administered 2023-08-06: 50 mg via ORAL

## 2023-08-06 MED ORDER — ONDANSETRON HCL 4 MG/2ML IJ SOLN
INTRAMUSCULAR | Status: AC
  Filled 2023-08-06: qty 2

## 2023-08-06 MED ORDER — KETAMINE HCL 10 MG/ML IJ SOLN
INTRAMUSCULAR | Status: DC | PRN
  Administered 2023-08-06 (×2): 20 mg via INTRAVENOUS

## 2023-08-06 MED ORDER — DOCUSATE SODIUM 100 MG PO CAPS
100.0000 mg | ORAL_CAPSULE | Freq: Two times a day (BID) | ORAL | Status: DC
  Administered 2023-08-06 – 2023-08-07 (×3): 100 mg via ORAL

## 2023-08-06 MED ORDER — ACETAMINOPHEN 10 MG/ML IV SOLN
INTRAVENOUS | Status: DC | PRN
  Administered 2023-08-06: 1000 mg via INTRAVENOUS

## 2023-08-06 MED ORDER — DOCUSATE SODIUM 100 MG PO CAPS
ORAL_CAPSULE | ORAL | Status: AC
  Filled 2023-08-06: qty 1

## 2023-08-06 MED ORDER — ACETAMINOPHEN 500 MG PO TABS
ORAL_TABLET | ORAL | Status: AC
  Filled 2023-08-06: qty 2

## 2023-08-06 MED ORDER — ENOXAPARIN SODIUM 30 MG/0.3ML IJ SOSY
30.0000 mg | PREFILLED_SYRINGE | Freq: Two times a day (BID) | INTRAMUSCULAR | Status: DC
  Administered 2023-08-07: 30 mg via SUBCUTANEOUS

## 2023-08-06 MED ORDER — ESCITALOPRAM OXALATE 10 MG PO TABS
20.0000 mg | ORAL_TABLET | Freq: Every day | ORAL | Status: DC
  Administered 2023-08-06: 20 mg via ORAL
  Filled 2023-08-06: qty 2

## 2023-08-06 MED ORDER — PHENOL 1.4 % MT LIQD: 1.0000 | OROMUCOSAL | Status: DC | PRN

## 2023-08-06 MED ORDER — SURGIPHOR WOUND IRRIGATION SYSTEM - OPTIME: TOPICAL | Status: DC | PRN

## 2023-08-06 MED ORDER — GLYCOPYRROLATE 0.2 MG/ML IJ SOLN
INTRAMUSCULAR | Status: AC
  Filled 2023-08-06: qty 1

## 2023-08-06 MED ORDER — LOSARTAN POTASSIUM 50 MG PO TABS
ORAL_TABLET | ORAL | Status: AC
  Filled 2023-08-06: qty 1

## 2023-08-06 MED ORDER — SODIUM CHLORIDE (PF) 0.9 % IJ SOLN
INTRAMUSCULAR | Status: DC | PRN
  Administered 2023-08-06: 61 mL via INTRAMUSCULAR

## 2023-08-06 MED ORDER — ONDANSETRON HCL 4 MG PO TABS: 4.0000 mg | ORAL_TABLET | Freq: Four times a day (QID) | ORAL | Status: DC | PRN

## 2023-08-06 MED ORDER — PANTOPRAZOLE SODIUM 40 MG PO TBEC
40.0000 mg | DELAYED_RELEASE_TABLET | Freq: Every day | ORAL | Status: DC
  Administered 2023-08-06 – 2023-08-07 (×2): 40 mg via ORAL

## 2023-08-06 MED ORDER — INSULIN ASPART 100 UNIT/ML IJ SOLN
0.0000 [IU] | Freq: Every day | INTRAMUSCULAR | Status: DC
  Administered 2023-08-06: 4 [IU] via SUBCUTANEOUS

## 2023-08-06 MED ORDER — OXYCODONE HCL 5 MG PO TABS
ORAL_TABLET | ORAL | Status: AC
  Filled 2023-08-06: qty 1

## 2023-08-06 MED ORDER — DEXAMETHASONE SODIUM PHOSPHATE 10 MG/ML IJ SOLN
INTRAMUSCULAR | Status: AC
  Filled 2023-08-06: qty 1

## 2023-08-06 MED ORDER — MENTHOL 3 MG MT LOZG: 1.0000 | LOZENGE | OROMUCOSAL | Status: DC | PRN

## 2023-08-06 MED ORDER — MORPHINE SULFATE (PF) 4 MG/ML IV SOLN: 0.5000 mg | INTRAVENOUS | Status: DC | PRN

## 2023-08-06 MED ORDER — PROPOFOL 10 MG/ML IV BOLUS
INTRAVENOUS | Status: DC | PRN
Start: 2023-08-06 — End: 2023-08-06
  Administered 2023-08-06: 30 mg via INTRAVENOUS
  Administered 2023-08-06: 20 mg via INTRAVENOUS
  Administered 2023-08-06: 50 mg via INTRAVENOUS
  Administered 2023-08-06: 30 mg via INTRAVENOUS

## 2023-08-06 MED ORDER — KETOROLAC TROMETHAMINE 15 MG/ML IJ SOLN
7.5000 mg | Freq: Four times a day (QID) | INTRAMUSCULAR | Status: AC
  Administered 2023-08-06 – 2023-08-07 (×4): 7.5 mg via INTRAVENOUS

## 2023-08-06 MED ORDER — PRAMIPEXOLE DIHYDROCHLORIDE 0.25 MG PO TABS
0.5000 mg | ORAL_TABLET | Freq: Every day | ORAL | Status: DC
  Administered 2023-08-06: 0.5 mg via ORAL
  Filled 2023-08-06: qty 2

## 2023-08-06 MED ORDER — CEFAZOLIN SODIUM-DEXTROSE 2-4 GM/100ML-% IV SOLN
2.0000 g | INTRAVENOUS | Status: AC
  Administered 2023-08-06: 2 g via INTRAVENOUS

## 2023-08-06 MED ORDER — ACETAMINOPHEN 10 MG/ML IV SOLN: 1000.0000 mg | Freq: Once | INTRAVENOUS | Status: DC | PRN

## 2023-08-06 MED ORDER — MIDAZOLAM HCL 2 MG/2ML IJ SOLN
INTRAMUSCULAR | Status: AC
  Filled 2023-08-06: qty 2

## 2023-08-06 MED ORDER — PROMETHAZINE HCL 25 MG/ML IJ SOLN: 6.2500 mg | INTRAMUSCULAR | Status: DC | PRN

## 2023-08-06 MED ORDER — FENTANYL CITRATE (PF) 100 MCG/2ML IJ SOLN: 25.0000 ug | INTRAMUSCULAR | Status: DC | PRN

## 2023-08-06 MED ORDER — FAMOTIDINE 20 MG PO TABS
20.0000 mg | ORAL_TABLET | Freq: Once | ORAL | Status: AC
  Administered 2023-08-06: 20 mg via ORAL

## 2023-08-06 MED ORDER — TRANEXAMIC ACID-NACL 1000-0.7 MG/100ML-% IV SOLN
1000.0000 mg | INTRAVENOUS | Status: AC
  Administered 2023-08-06: 1000 mg via INTRAVENOUS

## 2023-08-06 MED ORDER — FAMOTIDINE 20 MG PO TABS
ORAL_TABLET | ORAL | Status: AC
  Filled 2023-08-06: qty 1

## 2023-08-06 MED ORDER — AMLODIPINE BESYLATE 5 MG PO TABS
5.0000 mg | ORAL_TABLET | Freq: Every day | ORAL | Status: DC
  Administered 2023-08-06 – 2023-08-07 (×2): 5 mg via ORAL

## 2023-08-06 MED ORDER — PHENYLEPHRINE HCL-NACL 20-0.9 MG/250ML-% IV SOLN
INTRAVENOUS | Status: AC
  Filled 2023-08-06: qty 250

## 2023-08-06 MED ORDER — BUPIVACAINE-EPINEPHRINE (PF) 0.25% -1:200000 IJ SOLN
INTRAMUSCULAR | Status: AC
  Filled 2023-08-06: qty 30

## 2023-08-06 MED ORDER — SODIUM CHLORIDE 0.9 % IR SOLN
Status: DC | PRN
  Administered 2023-08-06: 3000 mL

## 2023-08-06 MED ORDER — ONDANSETRON HCL 4 MG/2ML IJ SOLN: 4.0000 mg | Freq: Four times a day (QID) | INTRAMUSCULAR | Status: DC | PRN

## 2023-08-06 SURGICAL SUPPLY — 78 items
ADH SKN CLS APL DERMABOND .7 (GAUZE/BANDAGES/DRESSINGS) ×1
APL PRP STRL LF DISP 70% ISPRP (MISCELLANEOUS) ×2
BLADE PATELLA W-PILOT HOLE 35 (BLADE) IMPLANT
BLADE SAGITTAL AGGR TOOTH XLG (BLADE) IMPLANT
BLADE SAW SAG 25X90X1.19 (BLADE) ×1 IMPLANT
BLADE SAW SAG 29X58X.64 (BLADE) ×1 IMPLANT
BOWL CEMENT MIX W/ADAPTER (MISCELLANEOUS) ×1 IMPLANT
BSPLAT TIB 5D F CMNT STM RT (Knees) ×1 IMPLANT
CEMENT BONE R 1X40 (Cement) ×2 IMPLANT
CHLORAPREP W/TINT 26 (MISCELLANEOUS) ×2 IMPLANT
COOLER POLAR GLACIER W/PUMP (MISCELLANEOUS) ×1 IMPLANT
CUFF TOURN SGL QUICK 24 (TOURNIQUET CUFF)
CUFF TOURN SGL QUICK 30 (TOURNIQUET CUFF) ×1
CUFF TRNQT CYL 24X4X16.5-23 (TOURNIQUET CUFF) IMPLANT
CUFF TRNQT CYL 30X4X21-28X (TOURNIQUET CUFF) IMPLANT
DERMABOND ADVANCED .7 DNX12 (GAUZE/BANDAGES/DRESSINGS) ×1 IMPLANT
DRAPE 3/4 80X56 (DRAPES) ×1 IMPLANT
DRAPE INCISE IOBAN 66X60 STRL (DRAPES) IMPLANT
DRSG MEPILEX SACRM 8.7X9.8 (GAUZE/BANDAGES/DRESSINGS) ×1 IMPLANT
DRSG OPSITE POSTOP 4X10 (GAUZE/BANDAGES/DRESSINGS) IMPLANT
DRSG OPSITE POSTOP 4X8 (GAUZE/BANDAGES/DRESSINGS) IMPLANT
ELECT REM PT RETURN 9FT ADLT (ELECTROSURGICAL) ×1
ELECTRODE REM PT RTRN 9FT ADLT (ELECTROSURGICAL) ×1 IMPLANT
FEMUR CMT CR STD SZ 8 RT KNEE (Joint) ×1 IMPLANT
FEMUR CMTD CR STD SZ 8 RT KNEE (Joint) IMPLANT
GLOVE BIO SURGEON STRL SZ8 (GLOVE) ×1 IMPLANT
GLOVE BIOGEL PI IND STRL 8 (GLOVE) ×1 IMPLANT
GLOVE PI ORTHO PRO STRL 7.5 (GLOVE) ×2 IMPLANT
GLOVE PI ORTHO PRO STRL SZ8 (GLOVE) ×2 IMPLANT
GLOVE SURG SYN 7.5 E (GLOVE) ×1 IMPLANT
GLOVE SURG SYN 7.5 PF PI (GLOVE) ×1 IMPLANT
GOWN SRG XL LVL 3 NONREINFORCE (GOWNS) ×1 IMPLANT
GOWN STRL NON-REIN TWL XL LVL3 (GOWNS) ×1
GOWN STRL REUS W/ TWL LRG LVL3 (GOWN DISPOSABLE) ×1 IMPLANT
GOWN STRL REUS W/ TWL XL LVL3 (GOWN DISPOSABLE) ×1 IMPLANT
GOWN STRL REUS W/TWL LRG LVL3 (GOWN DISPOSABLE) ×1
GOWN STRL REUS W/TWL XL LVL3 (GOWN DISPOSABLE) ×1
HANDLE YANKAUER SUCT OPEN TIP (MISCELLANEOUS) ×1 IMPLANT
HOOD PEEL AWAY T7 (MISCELLANEOUS) ×2 IMPLANT
INSERT TIBIA KNEE RIGHT 10 (Joint) IMPLANT
IV NS IRRIG 3000ML ARTHROMATIC (IV SOLUTION) ×1 IMPLANT
KIT TURNOVER KIT A (KITS) ×1 IMPLANT
MANIFOLD NEPTUNE II (INSTRUMENTS) ×1 IMPLANT
MARKER SKIN DUAL TIP RULER LAB (MISCELLANEOUS) ×1 IMPLANT
MAT ABSORB FLUID 56X50 GRAY (MISCELLANEOUS) ×1 IMPLANT
NDL FILTER BLUNT 18X1 1/2 (NEEDLE) ×1 IMPLANT
NDL HYPO 21X1.5 SAFETY (NEEDLE) ×1 IMPLANT
NDL SAFETY ECLIP 18X1.5 (MISCELLANEOUS) ×1 IMPLANT
NEEDLE FILTER BLUNT 18X1 1/2 (NEEDLE) IMPLANT
NEEDLE HYPO 21X1.5 SAFETY (NEEDLE) ×1 IMPLANT
PACK TOTAL KNEE (MISCELLANEOUS) ×1 IMPLANT
PAD ARMBOARD 7.5X6 YLW CONV (MISCELLANEOUS) ×3 IMPLANT
PAD WRAPON POLAR KNEE (MISCELLANEOUS) ×1 IMPLANT
PENCIL SMOKE EVACUATOR (MISCELLANEOUS) ×1 IMPLANT
PIN DRILL HDLS TROCAR 75 4PK (PIN) IMPLANT
PULSAVAC PLUS IRRIG FAN TIP (DISPOSABLE) ×1
SCREW FEMALE HEX FIX 25X2.5 (ORTHOPEDIC DISPOSABLE SUPPLIES) IMPLANT
SCREW HEX HEADED 3.5X27 DISP (ORTHOPEDIC DISPOSABLE SUPPLIES) IMPLANT
SLEEVE SCD COMPRESS KNEE MED (STOCKING) ×1 IMPLANT
SOLUTION IRRIG SURGIPHOR (IV SOLUTION) ×1 IMPLANT
STEM POLY PAT PLY 32M KNEE (Knees) IMPLANT
STEM TIB ST PERS 14+30 (Stem) IMPLANT
STEM TIBIA 5 DEG SZ F R KNEE (Knees) IMPLANT
SUT DVC 2 QUILL PDO T11 36X36 (SUTURE) ×1 IMPLANT
SUT QUILL MONODERM 3-0 PS-2 (SUTURE) ×1 IMPLANT
SUT VIC AB 0 CT1 36 (SUTURE) ×1 IMPLANT
SUT VIC AB 2-0 CT2 27 (SUTURE) ×2 IMPLANT
SUT VICRYL 1-0 27IN ABS (SUTURE) ×2
SUTURE VICRYL 1-0 27IN ABS (SUTURE) ×1 IMPLANT
SYR 30ML LL (SYRINGE) ×2 IMPLANT
SYR TB 1ML LL NO SAFETY (SYRINGE) ×1 IMPLANT
TAPE CLOTH 3X10 WHT NS LF (GAUZE/BANDAGES/DRESSINGS) ×1 IMPLANT
TIBIA STEM 5 DEG SZ F R KNEE (Knees) ×1 IMPLANT
TIP FAN IRRIG PULSAVAC PLUS (DISPOSABLE) ×1 IMPLANT
TOWEL OR 17X26 4PK STRL BLUE (TOWEL DISPOSABLE) IMPLANT
TRAP FLUID SMOKE EVACUATOR (MISCELLANEOUS) ×1 IMPLANT
WATER STERILE IRR 1000ML POUR (IV SOLUTION) ×1 IMPLANT
WRAPON POLAR PAD KNEE (MISCELLANEOUS) ×1

## 2023-08-06 NOTE — Anesthesia Postprocedure Evaluation (Signed)
Anesthesia Post Note  Patient: Alexander Rogers  Procedure(s) Performed: TOTAL KNEE ARTHROPLASTY (Right: Knee)  Patient location during evaluation: PACU Anesthesia Type: Spinal Level of consciousness: awake and alert Pain management: pain level controlled Vital Signs Assessment: post-procedure vital signs reviewed and stable Respiratory status: spontaneous breathing, nonlabored ventilation and respiratory function stable Cardiovascular status: blood pressure returned to baseline and stable Postop Assessment: no apparent nausea or vomiting and spinal receding Anesthetic complications: no   No notable events documented.   Last Vitals:  Vitals:   08/06/23 1130 08/06/23 1145  BP: 133/87 (!) 106/93  Pulse: 75 72  Resp: 20 (!) 23  Temp:  (!) 36.1 C  SpO2: 92% 94%    Last Pain:  Vitals:   08/06/23 1145  TempSrc:   PainSc: 2                  Foye Deer

## 2023-08-06 NOTE — Discharge Summary (Signed)
Physician Discharge Summary  Patient ID: Alexander Rogers MRN: [id number] DOB/AGE: 08-05-1956 67 y.o.  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   Restless leg 07/05/2016   Apnea, sleep 07/05/2016   Thrombocytopenia (HCC) 07/05/2015   Obstructive apnea 10/14/2014    Past Medical History:  Diagnosis Date   Allergy    Seasonal   Arthritis    Asthma    Bronchitis    COVID    Decreased white blood cell count    Diabetes mellitus without complication (HCC)    Diverticulosis    GAD (generalized anxiety disorder)    History of kidney stones    History of meniscal tear    History of pneumonia    Hyperlipidemia    Hypertension    Leukopenia    Pneumonia    Restless leg syndrome    Sleep apnea    on CPAP   Thrombocytopenia (HCC)      Transfusion: none   Consultants (if any):   Discharged Condition: Improved  Hospital Course: Alexander Rogers is an 67 y.o. male who was admitted 08/06/2023 with a diagnosis of S/P TKR (total knee replacement) using cement, right and went to the operating room on 08/06/2023 and underwent the above named procedures.    Surgeries: Procedure(s): TOTAL KNEE ARTHROPLASTY on 08/06/2023 Patient tolerated the surgery well. Taken to PACU where she was stabilized and then transferred to the orthopedic floor.  Started on Lovenox 30 mg q 12 hrs. TEDs and SCDs applied bilaterally. Heels elevated on bed. No evidence of DVT. Negative Homan. Physical therapy started on day #1 for gait training and transfer. OT started day #1 for ADL and assisted devices.  Patient's IV  was d/c on day #1. Patient was able to safely and independently complete all PT goals. PT recommending discharge to home.    On post op day #1 patient was stable and ready for discharge to home with HHPT.  Implants: Femur: Persona Size 8CR   Tibia: Persona Size F w/ 14x56mm stem extension  Poly: 10mm MC  Patella: 32x8.1mm Symmetric   He was given perioperative antibiotics:  Anti-infectives (From admission, onward)    Start     Dose/Rate Route Frequency Ordered Stop   08/06/23 1330  ceFAZolin (ANCEF) IVPB 2g/100 mL premix        2 g 200 mL/hr over 30 Minutes Intravenous Every 6 hours 08/06/23 1050 08/07/23 0129   08/06/23 0615  ceFAZolin (ANCEF) IVPB 2g/100 mL premix        2 g 200 mL/hr over 30 Minutes Intravenous On call to O.R. 08/06/23 4098 08/06/23 0810     .  He was given sequential compression devices, early ambulation, and Lovenox TEDs for DVT prophylaxis.  He benefited maximally from the hospital stay and there were no complications.    Recent vital signs:  Vitals:   08/06/23 1145 08/06/23 1414  BP: (!) 106/93 (!) 143/80  Pulse: 72 63  Resp: (!) 23 (!) 22  Temp: (!) 96.9 F (36.1 C) 98.8 F (37.1 C)  SpO2: 94% 96%    Recent laboratory studies:  Lab Results  Component  Value Date   HGB 13.6 07/24/2023   HGB 13.2 02/13/2023   HGB 14.3 01/31/2023   Lab Results  Component Value Date   WBC 3.4 (L) 07/24/2023   PLT 160 07/24/2023   Lab Results  Component Value Date   INR 1.0 03/30/2015   Lab Results  Component Value Date   NA 134 (L) 07/24/2023   K 3.5 07/24/2023   CL 104 07/24/2023   CO2 21 (L) 07/24/2023   BUN 18 07/24/2023   CREATININE 0.86 07/24/2023   GLUCOSE 193 (H) 07/24/2023    Discharge Medications:   Allergies as of 08/06/2023       Reactions   Tomato Rash     Med Rec must be completed prior to using this [facility]***       Diagnostic Studies: DG Knee Right Port  Result Date: 08/06/2023 CLINICAL DATA:  Status post knee  replacement EXAM: PORTABLE RIGHT KNEE - 1-2 VIEW COMPARISON:  None. FINDINGS: Status post right knee total arthroplasty with expected overlying postoperative change. No perihardware fracture or component malpositioning. IMPRESSION: Status post right knee total arthroplasty with expected overlying postoperative change. No perihardware fracture or component malpositioning. Electronically Signed   By: Jearld Lesch M.D.   On: 08/06/2023 10:47    Disposition:      Follow-up Information     Evon Slack, PA-C Follow up in 2 week(s).   Specialties: Orthopedic Surgery, Emergency Medicine Contact information: [address] [place] [address] [phone]                  Signed: Patience Musca 08/06/2023, 4:01 PM

## 2023-08-06 NOTE — Interval H&P Note (Signed)
Patient history and physical updated. Consent reviewed including risks, benefits, and alternatives to surgery. Patient agrees with above plan to proceed with right total knee arthroplasty  

## 2023-08-06 NOTE — H&P (Signed)
History of Present Illness: The patient is an 67 y.o. male seen in clinic today for follow-up evaluation of his right knee prior to planned right total knee replacement. He reports the pain has been going on for over a year which has been worsening over his anterior medial knee with sharp pain going up and down stairs or walking up or down an incline. He has been treated conservatively with anti-inflammatories and injections including steroids with minimal relief. He reports consistent pain up to a 7 or 8 out of 10 despite use of a brace and polar ice machine. At this point he is no longer interested in continuing conservative measures for his right knee. The patient denies fevers, chills, numbness, tingling, shortness of breath, chest pain, recent illness, or any trauma.  The patient is a non-smoker well-controlled diabetic with an A1c of 6.6, and his BMI is 32 Past Medical History: Past Medical History:  Diagnosis Date  Allergic rhinitis due to allergen  Allergic state  Anxiety  Arthritis 05/29/2017  Asthma without status asthmaticus (HHS-HCC)  Bronchitis, chronic (CMS/HHS-HCC)  Depression, major, single episode, complete remission (CMS-HCC) 11/28/2016  Diverticulosis 05/22/2017  Fatty liver  History of meniscal tear  History of pneumonia 2009  Hyperlipidemia  Hypertension  Obesity  Pneumonia  Primary osteoarthritis of both knees 05/29/2017  RLS (restless legs syndrome)  Sleep apnea  on CPAP  Type 2 diabetes mellitus (CMS/HHS-HCC) 02/2015   Past Surgical History: Past Surgical History:  Procedure Laterality Date  Vasectomy 1989  HERNIA REPAIR 2006  umbilical  COLONOSCOPY N/A 04/12/2006  Dr. Oval Linsey @ [facility] - Adenomatous Polyp  COLONOSCOPY N/A 06/29/2011  Dr. Demetrius Charity. Oh @ [facility] - Hemorrhoids, Poor Prep, PHP  COLONOSCOPY N/A 05/22/2017  Dr. Eber Hong @ [facility] - Diverticulosis, PHP, 5 yr rpt per [facility] (07/14/22 ltr [email or web address])  COLONOSCOPY 04/11/2023  PHx Adenoma/Repeat 72yrs/TKT  Elbow  surgery Right  biceps tendon  HEMORROIDECTOMY 1980s  with banading  KNEE ARTHROSCOPY 2009?  Meniscus Tear  Knee surgery Left  meniscus tear  TONSILLECTOMY as a child  as a child   Past Family History: Family History  Problem Relation Age of Onset  Atopy Mother  Arthritis Mother  COPD Mother  Anxiety Mother  Depression  Asthma Mother  Osteoarthritis Mother  Emphysema Mother  Basal cell carcinoma Father  Atopy Father  High blood pressure (Hypertension) Father  Skin cancer Father  Asthma Sister  Emphysema Maternal Grandmother  Frank breathing disorders; had emphysema.  Allergies Other  Alcohol abuse Son  Anxiety Sister  Asthma Sister  Asthma Sister  Colon cancer Neg Hx  Colon polyps Neg Hx   Medications: Current Outpatient Medications  Medication Sig Dispense Refill  ACCU-CHEK SMARTVIEW TEST STRIP test strip USE TO TEST BLOOD SUGAR ONCE DAILY 100 each 0  acetaminophen (TYLENOL) 500 MG tablet Take 1,000 mg by mouth every 8 (eight) hours  alcohol swabs (ALCOHOL PREP SWABS) PadM Apply 1 each topically every 7 (seven) days Before Ozempic administration. E11.9 100 each 3  amLODIPine (NORVASC) 10 MG tablet TAKE 1 TABLET BY MOUTH EVERY DAY 90 tablet 1  atorvastatin (LIPITOR) 10 MG tablet Take 1 tablet (10 mg total) by mouth once daily 90 tablet 3  beclomethasone dipropionate (QVAR REDIHALER HFA) 40 mcg/actuation inhaler Inhale 1 inhalation into the lungs 2 (two) times daily 10.6 g 6  blood-glucose meter (ACCU-CHEK NANO) Misc 1 each by XX route as directed 1 each 0  DULoxetine (CYMBALTA) 30 MG DR capsule Take 30 mg by mouth  once daily (Patient not taking: Reported on 02/27/2023)  escitalopram oxalate (LEXAPRO) 20 MG tablet TAKE 1 TABLET BY MOUTH EVERY DAY 90 tablet 1  fluticasone propionate (FLONASE) 50 mcg/actuation nasal spray Place 2 sprays into both nostrils once daily (Patient not taking: Reported on 02/27/2023)  lancing device (ACCU-CHEK SOFTCLIX LANCET DEV) Misc Use to check  blood sugars 1 time a day 30 each 5  losartan (COZAAR) 100 MG tablet Take 1 tablet (100 mg total) by mouth once daily 90 tablet 3  meloxicam (MOBIC) 15 MG tablet TAKE 1 TABLET BY MOUTH EVERY DAY 30 tablet 5  metFORMIN (GLUCOPHAGE) 1000 MG tablet TAKE 1 TABLET BY MOUTH TWICE A DAY WITH FOOD 180 tablet 1  mometasone (ELOCON) 0.1 % lotion INSTILL 4 DROPS IN EACH EAR AT BEDTIME AS NEEDED FOR DRY SKIN AND ITCHING 12  mometasone-dimethicone (QUINIXIL) 0.1-5 % Crea Apply 1 drop topically as needed  montelukast (SINGULAIR) 10 mg tablet take 1 tablet by mouth every day in the evening 90 tablet 1  pen needle, diabetic 31 gauge x 5/16" needle Use as directed With Ozempic pens E11.9 100 each 3  pramipexole (MIRAPEX) 0.5 MG tablet Take 1 tablet (0.5 mg total) by mouth at bedtime 90 tablet 1  semaglutide (OZEMPIC) 0.25 mg or 0.5 mg (2 mg/3 mL) pen injector INJECT 0.375 ML (0.5 MG TOTAL) SUBCUTANEOUSLY ONCE A WEEK 9 mL 1  sodium, potassium, and magnesium (SUPREP) oral solution Take 2 Bottles (1 kit total) by mouth as directed One kit contains 2 bottles. Take both bottles at the times instructed by your provider. (Patient not taking: Reported on 02/27/2023) 354 mL 0  tamsulosin (FLOMAX) 0.4 mg capsule Take 1 capsule (0.4 mg total) by mouth once daily for 14 days Take 30 minutes after same meal each day. (Patient not taking: Reported on 03/27/2023) 14 capsule 0   No current facility-administered medications for this visit.   Allergies: Allergies  Allergen Reactions  Glutamine-C-Quercet-Selen-Brom Anaphylaxis  tomatoes  Fish Containing Products Swelling  Was told to avoid shell fish; able to eat some fish  Tomato Rash    Visit Vitals: Vitals:  07/24/23 1319  BP: 138/72    Review of Systems:  A comprehensive 14 point ROS was performed, reviewed, and the pertinent orthopaedic findings are documented in the HPI.  Physical Exam: General/Constitutional: No apparent distress: well-nourished and well  developed. Eyes: Pupils equal, round with synchronous movement. Pulmonary exam: Lungs clear to auscultation bilaterally no wheezing rales or rhonchi Cardiac exam: Regular rate and rhythm no obvious murmurs rubs or gallops. Integumentary: No impressive skin lesions present, except as noted in detailed exam. Neuro/Psych: Normal mood and affect, oriented to person, place and time.  Comprehensive Knee Exam: Gait Antalgic and stiff  Alignment Neutral   Inspection Right  Skin Normal appearance with no obvious deformity. No ecchymosis or erythema.  Soft Tissue No focal soft tissue swelling  Quad Atrophy None   Palpation  Right  Tenderness Medial joint line tenderness to palpation  Crepitus + patellofemoral and tibiofemoral crepitus  Effusion None   Range of Motion Right  Flexion 0-125  Extension Full knee extension without hyperextension   Ligamentous Exam Right  Lachman Normal  Valgus 0 Normal  Valgus 30 Normal  Varus 0 Normal  Varus 30 Normal  Anterior Drawer Normal  Posterior Drawer Normal   Meniscal Exam Right  Hyperflexion Test Positive  Hyperextension Test Positive  McMurray's Negative   Neurovascular Right  Quadriceps Strength 5/5  Hamstring Strength 5/5  Hip Abductor Strength 4/5  Distal Motor Normal  Distal Sensory Normal light touch sensation  Distal Pulses Normal    Imaging Studies: I have reviewed AP, lateral,sunrise, and flexed PA weight bearing knee X-rays (4 views) of the right knee taken the previous office visit show mod/severe degenerative changes with medial and patellofemoral joint space narrowing and osteophyte formation, subchondral cysts and sclerosis. KL 3/4. AP, sunrise, and flexed PA of the left knee also show s/p total knee arthroplasty with components in appropriate position. No fractures noted.   Assessment:  Right knee osteoarthritis  Plan: Marris is a 67 year old male who presents with right knee bone on bone arthritis. Based upon  the patient's continued symptoms and failure to respond to conservative treatment, I have recommended a right total knee replacement for this patient. A long discussion took place with the patient describing what a total joint replacement is and what the procedure would entail. A knee model, similar to the implants that will be used during the operation, was utilized to demonstrate the implants. Choices of implant manufactures were discussed and reviewed. The ability to secure the implant utilizing cement or cementless (press fit) fixation was discussed. The approach and exposure was discussed.   The hospitalization and post-operative care and rehabilitation were also discussed. The use of perioperative antibiotics and DVT prophylaxis were discussed. The risk, benefits and alternatives to a surgical intervention were discussed at length with the patient. The patient was also advised of risks related to the medical comorbidities and elevated body mass index (BMI). A lengthy discussion took place to review the most common complications including but not limited to: stiffness, loss of function, complex regional pain syndrome, deep vein thrombosis, pulmonary embolus, heart attack, stroke, infection, wound breakdown, numbness, intraoperative fracture, damage to nerves, tendon,muscles, arteries or other blood vessels, death and other possible complications from anesthesia. The patient was told that we will take steps to minimize these risks by using sterile technique, antibiotics and DVT prophylaxis when appropriate and follow the patient postoperatively in the office setting to monitor progress. The possibility of recurrent pain, no improvement in pain and actual worsening of pain were also discussed with the patient.   The discharge plan of care focused on the patient going home following surgery. The patient was encouraged to make the necessary arrangements to have someone stay with them when they are discharged  home.   The benefits of surgery were discussed with the patient including the potential for improving the patient's current clinical condition through operative intervention. Alternatives to surgical intervention including continued conservative management were also discussed in detail. All questions were answered to the satisfaction of the patient. The patient participated and agreed to the plan of care as well as the use of the recommended implants for their total knee replacement surgery. An information packet was given to the patient to review prior to surgery.   Patient received medical clearance for surgery. All questions were answered the patient agrees the above plan to make preparations for a right total knee replacement   Portions of this record have been created using Dragon dictation software. Dictation errors have been sought, but may not have been identified and corrected.  Reinaldo Berber MD

## 2023-08-06 NOTE — Anesthesia Preprocedure Evaluation (Addendum)
Anesthesia Evaluation  Patient identified by MRN, date of birth, ID band Patient awake    Reviewed: Allergy & Precautions, NPO status , Patient's Chart, lab work & pertinent test results  Airway Mallampati: III  TM Distance: <3 FB Neck ROM: Full    Dental  (+) Teeth Intact   Pulmonary asthma , sleep apnea and Continuous Positive Airway Pressure Ventilation    Pulmonary exam normal        Cardiovascular Exercise Tolerance: Good hypertension, Pt. on medications Normal cardiovascular exam Rhythm:Regular Rate:Normal     Neuro/Psych   Anxiety     negative neurological ROS     GI/Hepatic negative GI ROS, Neg liver ROS,,,  Endo/Other  diabetes, Type 2, Oral Hypoglycemic Agents    Renal/GU negative Renal ROS  negative genitourinary   Musculoskeletal  (+) Arthritis ,    Abdominal  (+) + obese  Peds negative pediatric ROS (+)  Hematology negative hematology ROS (+)   Anesthesia Other Findings Past Medical History: No date: Allergy     Comment:  Seasonal No date: Arthritis No date: Asthma No date: Bronchitis No date: Decreased white blood cell count No date: Diabetes mellitus without complication No date: Diverticulosis No date: GAD (generalized anxiety disorder) No date: History of kidney stones No date: History of meniscal tear No date: History of pneumonia No date: Hyperlipidemia No date: Hypertension No date: Leukopenia No date: Pneumonia No date: Restless leg syndrome No date: Sleep apnea     Comment:  on CPAP No date: Thrombocytopenia  Past Surgical History: 06/29/2011: COLONOSCOPY 05/22/2017: COLONOSCOPY WITH PROPOFOL; N/A     Comment:  Procedure: COLONOSCOPY WITH PROPOFOL;  Surgeon:               Christena Deem, MD;  Location: [facility] ENDOSCOPY;                Service: Endoscopy;  Laterality: N/A; No date: ELBOW ARTHROSCOPY WITH TENDON RECONSTRUCTION; Right No date: EXCISIONAL HEMORRHOIDECTOMY No  date: HERNIA REPAIR No date: MENISCUS REPAIR; Left     Comment:  Knee surgery for meniscus tear left side No date: TONSILLECTOMY 02/12/2023: TOTAL KNEE ARTHROPLASTY; Left     Comment:  Procedure: TOTAL KNEE ARTHROPLASTY;  Surgeon: Reinaldo Berber, MD;  Location: [facility] ORS;  Service: Orthopedics;               Laterality: Left; 1989: VASECTOMY  BMI    Body Mass Index: 32.02 kg/m      Reproductive/Obstetrics negative OB ROS                             Anesthesia Physical Anesthesia Plan  ASA: 3  Anesthesia Plan: Spinal   Post-op Pain Management:    Induction: Intravenous  PONV Risk Score and Plan: Propofol infusion and TIVA  Airway Management Planned: Natural Airway  Additional Equipment:   Intra-op Plan:   Post-operative Plan:   Informed Consent: I have reviewed the patients History and Physical, chart, labs and discussed the procedure including the risks, benefits and alternatives for the proposed anesthesia with the patient or authorized representative who has indicated his/her understanding and acceptance.     Dental Advisory Given  Plan Discussed with: CRNA and Surgeon  Anesthesia Plan Comments:        Anesthesia Quick Evaluation

## 2023-08-06 NOTE — Discharge Instructions (Signed)

## 2023-08-06 NOTE — Evaluation (Signed)
Physical Therapy Evaluation Patient Details Name: Alexander Rogers MRN: [id number] DOB: 05-19-1956 Today's Date: 08/06/2023  History of Present Illness  Pt admitted for R TKR. PMH includes DM, HTN, HLD, and L TKR. Pt is POD 0 at time of evaluation.  Clinical Impression  Pt received in bed, he is agreeable to PT session. Pt performs bed mobility and transfers mod I, and amb with CGA for safety. AROM: R knee flexion- 106, R knee extension: -1. Pt able to amb approx 60 ft with RW with no reports of R knee pain. Cuing needed to maintain RW closer to trunk for safety. Educated Pt on maintaining a towel underneath ankle on RLE to prevent knee flexion during resting-Pt and family verbalized understanding. Pt in recliner with polar ice placed on R knee. Pt would benefit from skilled PT to address above deficits and promote optimal return to PLOF.        If plan is discharge home, recommend the following: A little help with walking and/or transfers;A little help with bathing/dressing/bathroom;Assistance with cooking/housework;Assist for transportation;Help with stairs or ramp for entrance   Can travel by private vehicle        Equipment Recommendations None recommended by PT  Recommendations for Other Services       Functional Status Assessment Patient has had a recent decline in their functional status and demonstrates the ability to make significant improvements in function in a reasonable and predictable amount of time.     Precautions / Restrictions Precautions Precautions: Knee;Fall Precaution Booklet Issued: No Precaution Comments: R TKR Restrictions Weight Bearing Restrictions: Yes RLE Weight Bearing: Weight bearing as tolerated      Mobility  Bed Mobility Overal bed mobility: Modified Independent             General bed mobility comments: Occasional RLE guidance to EOB to reduce friction but otherwise independent    Transfers Overall transfer level: Modified  independent Equipment used: Rolling walker (2 wheels)               General transfer comment: Able to STS with min cues for hand placement    Ambulation/Gait Ambulation/Gait assistance: Contact guard assist Gait Distance (Feet): 60 Feet Assistive device: Rolling walker (2 wheels) Gait Pattern/deviations: Step-through pattern, Decreased step length - right, Decreased step length - left Gait velocity: Decreased     General Gait Details: Able to amb with RW with cuing to keep RW closer to trunk for safety  Stairs            Wheelchair Mobility     Tilt Bed    Modified Rankin (Stroke Patients Only)       Balance Overall balance assessment: Needs assistance Sitting-balance support: Feet supported Sitting balance-Leahy Scale: Good Sitting balance - Comments: Able to sustain seated balance during functional activities   Standing balance support: Bilateral upper extremity supported, During functional activity Standing balance-Leahy Scale: Good Standing balance comment: Able to maintain static standing balance with no difficulty and occasional no BUE support                             Pertinent Vitals/Pain Pain Assessment Pain Assessment: Faces Faces Pain Scale: Hurts a little bit Pain Location: R TKR Pain Descriptors / Indicators: Operative site guarding, Sore Pain Intervention(s): Monitored during session, Premedicated before session    Home Living Family/patient expects to be discharged to:: Private residence Living Arrangements: Spouse/significant other Available Help at Discharge: Family;Available  24 hours/day Type of Home: House Home Access: Level entry       Home Layout: Two level;Able to live on main level with bedroom/bathroom Home Equipment: None      Prior Function Prior Level of Function : Independent/Modified Independent             Mobility Comments: indep, reports no falls ADLs Comments: indep, plays in local band      Extremity/Trunk Assessment   Upper Extremity Assessment Upper Extremity Assessment: Overall WFL for tasks assessed    Lower Extremity Assessment Lower Extremity Assessment: Generalized weakness (Did not formally assess d/t surgery but overall 3/5 MMT RLE)       Communication   Communication Communication: No apparent difficulties  Cognition Arousal: Alert Behavior During Therapy: WFL for tasks assessed/performed Overall Cognitive Status: Within Functional Limits for tasks assessed                                 General Comments: Pleasant and eager to work with PT        General Comments      Exercises Total Joint Exercises Goniometric ROM: R knee extension: -1, R knee flexion: 106 with no reports of pain   Assessment/Plan    PT Assessment Patient needs continued PT services  PT Problem List Decreased strength;Decreased mobility;Decreased range of motion;Decreased activity tolerance;Pain       PT Treatment Interventions DME instruction;Gait training;Stair training;Functional mobility training;Therapeutic activities;Neuromuscular re-education    PT Goals (Current goals can be found in the Care Plan section)  Acute Rehab PT Goals Patient Stated Goal: to go home PT Goal Formulation: With patient Time For Goal Achievement: 08/20/23 Potential to Achieve Goals: Good    Frequency BID     Co-evaluation               AM-PAC PT "6 Clicks" Mobility  Outcome Measure Help needed turning from your back to your side while in a flat bed without using bedrails?: A Little Help needed moving from lying on your back to sitting on the side of a flat bed without using bedrails?: A Little Help needed moving to and from a bed to a chair (including a wheelchair)?: A Little Help needed standing up from a chair using your arms (e.g., wheelchair or bedside chair)?: A Little Help needed to walk in hospital room?: A Little Help needed climbing 3-5 steps with a  railing? : A Little 6 Click Score: 18    End of Session Equipment Utilized During Treatment: Gait belt Activity Tolerance: Patient tolerated treatment well;No increased pain Patient left: in chair;with call bell/phone within reach;with family/visitor present Nurse Communication: Mobility status PT Visit Diagnosis: Muscle weakness (generalized) (M62.81);Pain Pain - Right/Left: Right Pain - part of body: Knee    Time: 5366-4403 PT Time Calculation (min) (ACUTE ONLY): 19 min   Charges:                 Elmon Else, SPT   Antion Andres 08/06/2023, 4:14 PM

## 2023-08-06 NOTE — Transfer of Care (Signed)
Immediate Anesthesia Transfer of Care Note  Patient: Alexander Rogers  Procedure(s) Performed: TOTAL KNEE ARTHROPLASTY (Right: Knee)  Patient Location: PACU  Anesthesia Type:Spinal  Level of Consciousness: drowsy  Airway & Oxygen Therapy: Patient Spontanous Breathing and Patient connected to face mask oxygen  Post-op Assessment: Report given to RN and Post -op Vital signs reviewed and stable  Post vital signs: Reviewed and stable  Last Vitals:  Vitals Value Taken Time  BP 115/74 08/06/23 1004  Temp 97.5   Pulse 72 08/06/23 1008  Resp 16 08/06/23 1008  SpO2 95 % 08/06/23 1008  Vitals shown include unfiled device data.  Last Pain:  Vitals:   08/06/23 0615  TempSrc: Oral  PainSc: 0-No pain         Complications: No notable events documented.

## 2023-08-06 NOTE — Op Note (Signed)
Patient Name: Alexander Rogers  UJW:[id number]  Pre-Operative Diagnosis: Right knee Osteoarthritis  Post-Operative Diagnosis: (same)  Procedure: Right Total Knee Arthroplasty  Components/Implants: Femur: Persona Size 8CR   Tibia: Persona Size F w/ 14x40mm stem extension  Poly: 10mm MC  Patella: 32x8.20mm Symmetric  Femoral Valgus Cut Angle: 5 degrees  Distal Femoral Re-cut: none  Patella Resurfacing: yes   Date of Surgery: 08/06/2023  Surgeon: Reinaldo Berber MD  Assistant: Amador Cunas pA (present and scrubbed throughout the case, critical for assistance with exposure, retraction, instrumentation, and closure)   Anesthesiologist: Laural Benes  Anesthesia: Spinal   Tourniquet Time: 72 min  EBL: 75cc  IVF: 600cc  Complications: None   Brief history: The patient is a 67 year old male with a history of osteoarthritis of the right knee with pain limiting their range of motion and activities of daily living, which has failed multiple attempts at conservative therapy.  The risks and benefits of total knee arthroplasty as definitive surgical treatment were discussed with the patient, who opted to proceed with the operation.  After outpatient medical clearance and optimization was completed the patient was admitted to [facility] for the procedure.  All preoperative films were reviewed and an appropriate surgical plan was made prior to surgery. Preoperative range of motion was 0 to 125. The patient was identified as having a varus alignment.   Description of procedure: The patient was brought to the operating room where laterality was confirmed by all those present to be the right side.   Spinal anesthesia was administered and the patient received an intravenous dose of antibiotics for surgical prophylaxis and a dose of tranexamic acid.  Patient is positioned supine on the operating room table with all bony prominences well-padded.  A well-padded tourniquet was  applied to the right thigh.  The knee was then prepped and draped in usual sterile fashion with multiple layers of adhesive and nonadhesive drapes.  All of those present in the operating room participated in a surgical timeout laterality and patient were confirmed.   An Esmarch was wrapped around the extremity and the leg was elevated and the knee flexed.  The tourniquet was inflated to a pressure of 275 mmHg. The Esmarch was removed and the leg was brought down to full extension.  The patella and tibial tubercle identified and outlined using a marking pen and a midline skin incision was made with a knife carried through the subcutaneous tissue down to the extensor retinaculum.  After exposure of the extensor mechanism the medial parapatellar arthrotomy was performed with a scalpel and electrocautery extending down medial and distal to the tibial tubercle taking care to avoid incising the patellar tendon.   A standard medial release was performed over the proximal tibia.  The knee was brought into extension in order to excise the fat pad taking care not to damage the patella tendon.  The superior soft tissue was removed from the anterior surface of the distal femur to visualize for the procedure.  The knee was then brought into flexion with the patella subluxed laterally and subluxing the tibia anteriorly.  The ACL was transected and removed with electrocautery and additional soft tissue was removed from the proximal surface of the tibia to fully expose. The PCL was found to be intact and was preserved.  An extramedullary tibial cutting guide was then applied to the leg with a spring-loaded ankle clamp placed around the distal tibia just above the malleoli the angulation of the guide was adjusted to give  some posterior slope in the tibial resection with an appropriate varus/valgus alignment.  The resection guide was then pinned to the proximal tibia and the proximal tibial surface was resected with an  oscillating saw.  Careful attention was paid to ensure the blade did not disrupt any of the soft tissues including any lateral or medial ligament.  Attention was then turned to the femur, with the knee slightly flexed a opening drill was used to enter the medullary canal of the femur.  After removing the drill marrow was suctioned out to decompress the distal femur.  An intramedullary femoral guide was then inserted into the drill hole and the alignment guide was seated firmly against the distal end of the medial femoral condyle.  The distal femoral cutting guide was then attached and pinned securely to the anterior surface of the femur and the intramedullary rod and alignment guide was removed.  Distal femur resection was then performed with an oscillating saw with retractors protecting medial and laterally.   The distal cutting block was then removed and the extension gap was checked with a spacer.  Extension gap was found to be appropriately sized to accommodate the spacer block.   The femoral sizing guide was then placed securely into the posterior condyles of the femur and the femoral size was measured and determined to be 8.  The size 8; 4-in-1 cutting guide was placed in position and secured with 2 pins.  The anterior posterior and chamfer resections were then performed with an oscillating saw.  Bony fragments and osteophytes were then removed.  Using a lamina spreader the posterior medial and lateral condyles were checked for additional osteophytes and posterior soft tissue remnants.  Any remaining meniscus was removed at this time.  Periarticular injection was performed in the meniscal rims and posterior capsule with aspiration performed to ensure no intravascular injection.   The tibia was then exposed and the tibial trial was pinned onto the plateau after confirming appropriate orientation and rotation.  Using the drill bushing the tibia was prepared to the appropriate drill depth.  Tibial broach  impactor was then driven through the punch guide using a mallet.  The femoral trial component was then inserted onto the femur. A trial tibial polyethylene bearing was then placed and the knee was reduced.  The knee achieved full extension with no hyperextension and was found to be balanced in flexion and extension with the trials in place.  The knee was then brought into full extension the patella was everted and held with 2 Kocher clamps.  The articular surface of the patella was then resected with an patella reamer and saw after careful measurement with a caliper.  The patella was then prepared with the drill guide and a trial patella was placed.  The knee was then taken through range of motion and it was found that the patella articulated appropriately with the trochlea and good patellofemoral motion without subluxation.    The correct final components for implantation were confirmed and opened by the circulator nurse.  The prepared surfaces of the patella femur and tibia were cleaned with pulsatile lavage to remove all blood fat and other material and then the surfaces were dried.  2 bags of cement were mixed under vacuum and the components were cemented into place.  Excess cement was removed with curettes and forceps. A trial polyethylene tibial component was placed and the knee was brought into extension to allow the cement to set.  At this time the periarticular  injection cocktail was placed in the soft tissues surrounding the knee.  After full curing of the cement the balance of the knee was checked again and the final polyethylene size was confirmed. The tibial component was irrigated and locking mechanism checked to ensure it was clear of debris. The real polyethylene tibial component was implanted and the knee was brought through a range of motion.   The knee was then irrigated with copious amount of normal saline via pulsatile lavage to remove all loose bodies and other debris.  The knee was then  irrigated with surgiphor betadine based wash and reirrigated with saline.  The tourniquet was then dropped and all bleeding vessels were identified and coagulated.  The arthrotomy was approximated with #1 Vicryl and closed with #2 Quill suture.  The knee was brought into slight flexion and the subcutaneous tissues were closed with 0 Vicryl, 2-0 Vicryl and a running subcuticular 3-0 monoderm quil suture.  Skin was then glued with Dermabond.  A sterile adhesive dressing was then placed along with a sequential compression device to the calf, a Ted stocking, and a cryotherapy cuff.   Sponge, needle, and Lap counts were all correct at the end of the case.   The patient was transferred off of the operating room table to a hospital bed, good pulses were found distally on the operative side.  The patient was transferred to the recovery room in stable condition.

## 2023-08-07 ENCOUNTER — Encounter: Payer: Self-pay | Admitting: Orthopedic Surgery

## 2023-08-07 DIAGNOSIS — M1711 Unilateral primary osteoarthritis, right knee: Secondary | ICD-10-CM | POA: Diagnosis not present

## 2023-08-07 MED ORDER — LOSARTAN POTASSIUM 50 MG PO TABS
ORAL_TABLET | ORAL | Status: AC
  Filled 2023-08-07: qty 1

## 2023-08-07 MED ORDER — ACETAMINOPHEN 500 MG PO TABS: 1000.0000 mg | ORAL_TABLET | Freq: Three times a day (TID) | ORAL | 0 refills | Status: DC

## 2023-08-07 MED ORDER — KETOROLAC TROMETHAMINE 15 MG/ML IJ SOLN
INTRAMUSCULAR | Status: AC
  Filled 2023-08-07: qty 1

## 2023-08-07 MED ORDER — ENOXAPARIN SODIUM 40 MG/0.4ML IJ SOSY
40.0000 mg | PREFILLED_SYRINGE | INTRAMUSCULAR | 0 refills | Status: DC
Start: 2023-08-07 — End: 2024-02-06

## 2023-08-07 MED ORDER — PANTOPRAZOLE SODIUM 40 MG PO TBEC
DELAYED_RELEASE_TABLET | ORAL | Status: AC
  Filled 2023-08-07: qty 1

## 2023-08-07 MED ORDER — CELECOXIB 200 MG PO CAPS: 200.0000 mg | ORAL_CAPSULE | Freq: Two times a day (BID) | ORAL | 0 refills | Status: AC

## 2023-08-07 MED ORDER — DOCUSATE SODIUM 100 MG PO CAPS: 100.0000 mg | ORAL_CAPSULE | Freq: Two times a day (BID) | ORAL | 0 refills | Status: DC

## 2023-08-07 MED ORDER — ACETAMINOPHEN 500 MG PO TABS
ORAL_TABLET | ORAL | Status: AC
  Filled 2023-08-07: qty 2

## 2023-08-07 MED ORDER — DOCUSATE SODIUM 100 MG PO CAPS
ORAL_CAPSULE | ORAL | Status: AC
  Filled 2023-08-07: qty 1

## 2023-08-07 MED ORDER — ENOXAPARIN SODIUM 30 MG/0.3ML IJ SOSY
PREFILLED_SYRINGE | INTRAMUSCULAR | Status: AC
  Filled 2023-08-07: qty 0.3

## 2023-08-07 MED ORDER — AMLODIPINE BESYLATE 5 MG PO TABS
ORAL_TABLET | ORAL | Status: AC
  Filled 2023-08-07: qty 1

## 2023-08-07 MED ORDER — INSULIN ASPART 100 UNIT/ML IJ SOLN
INTRAMUSCULAR | Status: AC
  Filled 2023-08-07: qty 1

## 2023-08-07 MED ORDER — ONDANSETRON HCL 4 MG PO TABS: 4.0000 mg | ORAL_TABLET | Freq: Four times a day (QID) | ORAL | 0 refills | Status: DC | PRN

## 2023-08-07 MED ORDER — TRAMADOL HCL 50 MG PO TABS: 50.0000 mg | ORAL_TABLET | Freq: Four times a day (QID) | ORAL | 0 refills | Status: DC | PRN

## 2023-08-07 NOTE — Progress Notes (Signed)
Physical Therapy Treatment Patient Details Name: Alexander Rogers MRN: [id number] DOB: August 25, 1956 Today's Date: 08/07/2023   History of Present Illness Pt admitted for R TKR. PMH includes DM, HTN, HLD, and L TKR. Pt is POD 0 at time of evaluation.    PT Comments  Pt progressing towards PT goals. Pt received in bed, he is agreeable to PT session. AROM: R knee flexion- 115, R knee extension: 0. Pt performs bed mobility independently, transfers modified independent, and amb with supervision assist for safety. Pt able to amb 215 ft with no reports of increased R knee discomfort/pain. Min cuing during amb to maintain RW within base of support to reduce risk of falls. Reviewed and performed HEP for TKR protocol with patient-Pt and spouse verbalized understanding. Pt would benefit from skilled PT to address above deficits and promote optimal return to PLOF.    If plan is discharge home, recommend the following: A little help with walking and/or transfers;A little help with bathing/dressing/bathroom;Assistance with cooking/housework;Assist for transportation;Help with stairs or ramp for entrance   Can travel by private vehicle        Equipment Recommendations  None recommended by PT    Recommendations for Other Services       Precautions / Restrictions Precautions Precautions: Knee;Fall Precaution Booklet Issued: No Precaution Comments: R TKR Restrictions Weight Bearing Restrictions: Yes RLE Weight Bearing: Weight bearing as tolerated     Mobility  Bed Mobility Overal bed mobility: Independent             General bed mobility comments: Able to perform BLE independently    Transfers Overall transfer level: Modified independent Equipment used: Rolling walker (2 wheels)               General transfer comment: Able to STS with RW with no cuing    Ambulation/Gait Ambulation/Gait assistance: Supervision Gait Distance (Feet): 215 Feet Assistive device: Rolling walker (2  wheels) Gait Pattern/deviations: Step-through pattern, Decreased step length - right, Decreased step length - left Gait velocity: Decreased     General Gait Details: Able to amb with RW with min cuing to keep RW closer to trunk for safety   Stairs             Wheelchair Mobility     Tilt Bed    Modified Rankin (Stroke Patients Only)       Balance Overall balance assessment: Needs assistance Sitting-balance support: Feet supported Sitting balance-Leahy Scale: Good Sitting balance - Comments: Able to sustain seated balance during functional activities   Standing balance support: Bilateral upper extremity supported, During functional activity Standing balance-Leahy Scale: Good Standing balance comment: Able to maintain static standing balance with no difficulty and occasional no BUE support                            Cognition Arousal: Alert Behavior During Therapy: WFL for tasks assessed/performed Overall Cognitive Status: Within Functional Limits for tasks assessed                                 General Comments: Pleasant and eager to work with PT        Exercises Total Joint Exercises Goniometric ROM: R knee extension: 0, R knee flexion: 115 with no reports of pain/discomfort Other Exercises Other Exercises: Reviewed and demonstrated HEP per TKR protocol    General Comments  Pertinent Vitals/Pain Pain Assessment Pain Assessment: Faces Faces Pain Scale: Hurts a little bit Pain Location: R TKR Pain Descriptors / Indicators: Operative site guarding, Sore Pain Intervention(s): Monitored during session    Home Living                          Prior Function            PT Goals (current goals can now be found in the care plan section) Acute Rehab PT Goals Patient Stated Goal: to go home PT Goal Formulation: With patient Time For Goal Achievement: 08/20/23 Potential to Achieve Goals: Good Progress towards  PT goals: Progressing toward goals    Frequency    BID      PT Plan      Co-evaluation              AM-PAC PT "6 Clicks" Mobility   Outcome Measure  Help needed turning from your back to your side while in a flat bed without using bedrails?: None Help needed moving from lying on your back to sitting on the side of a flat bed without using bedrails?: None Help needed moving to and from a bed to a chair (including a wheelchair)?: A Little Help needed standing up from a chair using your arms (e.g., wheelchair or bedside chair)?: A Little Help needed to walk in hospital room?: A Little Help needed climbing 3-5 steps with a railing? : A Little 6 Click Score: 20    End of Session Equipment Utilized During Treatment: Gait belt Activity Tolerance: Patient tolerated treatment well;No increased pain Patient left: in chair;with call bell/phone within reach;with family/visitor present Nurse Communication: Mobility status PT Visit Diagnosis: Muscle weakness (generalized) (M62.81);Pain Pain - Right/Left: Right Pain - part of body: Knee     Time: 0910-0928 PT Time Calculation (min) (ACUTE ONLY): 18 min  Charges:                            Elmon Else, SPT    Jameia Makris 08/07/2023, 10:49 AM

## 2023-08-07 NOTE — TOC Progression Note (Signed)
Transition of Care [facility]) - Progression Note    Patient Details  Name: Alexander Rogers MRN: [id number] Date of Birth: February 27, 1956  [facility]) CM/SW Contact  Marlowe Sax, RN Phone Number: 08/07/2023, 9:16 AM  Clinical Narrative:    Patient set up with [facility] prior to surgery by surgeons office Reports that he has no DME needs   Expected Discharge Plan: Home w Home Health Services Barriers to Discharge: No Barriers Identified  Expected Discharge Plan and Services       Living arrangements for the past 2 months: Single Family Home Expected Discharge Date: 08/07/23               DME Arranged: N/A DME Agency: NA       HH Arranged: PT HH Agency: [facility] Date HH Agency Contacted: 08/07/23 Time HH Agency Contacted: 0915 Representative spoke with at [facility] Agency: Barbara Cower   Social Determinants of Health (SDOH) Interventions SDOH Screenings   Food Insecurity: No Food Insecurity (08/06/2023)  Housing: Low Risk  (08/06/2023)  Transportation Needs: No Transportation Needs (08/06/2023)  Utilities: Not At Risk (08/06/2023)  Tobacco Use: Low Risk  (08/06/2023)    Readmission Risk Interventions     No data to display

## 2023-08-07 NOTE — Progress Notes (Addendum)
   Subjective: 1 Day Post-Op Procedure(s) (LRB): TOTAL KNEE ARTHROPLASTY (Right) Patient reports pain as mild.   Patient is well, and has had no acute complaints or problems Denies any CP, SOB, ABD pain. We will continue therapy today.  Plan is to go Home after hospital stay.  Objective: Vital signs in last 24 hours: Temp:  [96.9 F (36.1 C)-98.8 F (37.1 C)] 98.3 F (36.8 C) (08/13 0707) Pulse Rate:  [62-75] 62 (08/13 0707) Resp:  [13-30] 18 (08/13 0500) BP: (106-146)/(64-93) 145/85 (08/13 0707) SpO2:  [91 %-96 %] 93 % (08/13 0707)  Intake/Output from previous day: 08/12 0701 - 08/13 0700 In: 3653.5 [P.O.:1340; I.V.:1913.5; IV Piggyback:400] Out: 3375 [Urine:3300; Blood:75] Intake/Output this shift: No intake/output data recorded.  Recent Labs    08/07/23 0617  HGB 12.2*   Recent Labs    08/07/23 0617  WBC 8.8  RBC 4.17*  HCT 36.0*  PLT 170   Recent Labs    08/07/23 0617  NA 132*  K 3.9  CL 103  CO2 21*  BUN 24*  CREATININE 0.95  GLUCOSE 224*  CALCIUM 8.8*   No results for input(s): "LABPT", "INR" in the last 72 hours.  EXAM General - Patient is Alert, Appropriate, and Oriented Extremity - Sensation intact distally Intact pulses distally Dorsiflexion/Plantar flexion intact No cellulitis present Compartment soft Dressing - dressing C/D/I and no drainage Motor Function - intact, moving foot and toes well on exam.   Past Medical History:  Diagnosis Date   Allergy    Seasonal   Arthritis    Asthma    Bronchitis    COVID    Decreased white blood cell count    Diabetes mellitus without complication (HCC)    Diverticulosis    GAD (generalized anxiety disorder)    History of kidney stones    History of meniscal tear    History of pneumonia    Hyperlipidemia    Hypertension    Leukopenia    Pneumonia    Restless leg syndrome    Sleep apnea    on CPAP   Thrombocytopenia (HCC)     Assessment/Plan:   1 Day Post-Op Procedure(s)  (LRB): TOTAL KNEE ARTHROPLASTY (Right) Principal Problem:   S/P TKR (total knee replacement) using cement, right  Estimated body mass index is 33 kg/m as calculated from the following:   Height as of this encounter: 5\' 10"  (1.778 m).   Weight as of this encounter: 104.3 kg. Advance diet Up with therapy Pain controlled Labs and VS are stable CM to assist with discharge to home with HHPT today  DVT Prophylaxis - Lovenox, TED hose, and SCDs Weight-Bearing as tolerated to right leg   T. Cranston Neighbor, PA-C [facility] 08/07/2023, 7:53 AM   Patient seen and examined, agree with above plan.  The patient is doing well status post right total knee arthroplasty, no concerns at this time.  Pain is controlled.  Discussed DVT prophylaxis, pain medication use, and safe transition to home.  All questions answered the patient agrees with above plan will go home after clears PT.   Reinaldo Berber MD

## 2023-08-07 NOTE — Progress Notes (Signed)
DISCHARGE NOTE:   Pt given discharge instructions and verbalized understanding. TED hose on both legs. Pt wheeled to car by staff. Wife providing transportation.

## 2023-08-07 NOTE — Plan of Care (Signed)
  Problem: Pain Management: Goal: Pain level will decrease with appropriate interventions Outcome: Progressing   Problem: Skin Integrity: Goal: Will show signs of wound healing Outcome: Progressing   

## 2023-08-09 DIAGNOSIS — E669 Obesity, unspecified: Secondary | ICD-10-CM | POA: Diagnosis not present

## 2023-08-09 DIAGNOSIS — D696 Thrombocytopenia, unspecified: Secondary | ICD-10-CM | POA: Diagnosis not present

## 2023-08-09 DIAGNOSIS — Z7985 Long-term (current) use of injectable non-insulin antidiabetic drugs: Secondary | ICD-10-CM | POA: Diagnosis not present

## 2023-08-09 DIAGNOSIS — Z471 Aftercare following joint replacement surgery: Secondary | ICD-10-CM | POA: Diagnosis not present

## 2023-08-09 DIAGNOSIS — F411 Generalized anxiety disorder: Secondary | ICD-10-CM | POA: Diagnosis not present

## 2023-08-09 DIAGNOSIS — Z7901 Long term (current) use of anticoagulants: Secondary | ICD-10-CM | POA: Diagnosis not present

## 2023-08-09 DIAGNOSIS — Z96653 Presence of artificial knee joint, bilateral: Secondary | ICD-10-CM | POA: Diagnosis not present

## 2023-08-09 DIAGNOSIS — I10 Essential (primary) hypertension: Secondary | ICD-10-CM | POA: Diagnosis not present

## 2023-08-09 DIAGNOSIS — F325 Major depressive disorder, single episode, in full remission: Secondary | ICD-10-CM | POA: Diagnosis not present

## 2023-08-09 DIAGNOSIS — K760 Fatty (change of) liver, not elsewhere classified: Secondary | ICD-10-CM | POA: Diagnosis not present

## 2023-08-09 DIAGNOSIS — E785 Hyperlipidemia, unspecified: Secondary | ICD-10-CM | POA: Diagnosis not present

## 2023-08-09 DIAGNOSIS — Z791 Long term (current) use of non-steroidal anti-inflammatories (NSAID): Secondary | ICD-10-CM | POA: Diagnosis not present

## 2023-08-09 DIAGNOSIS — Z8616 Personal history of COVID-19: Secondary | ICD-10-CM | POA: Diagnosis not present

## 2023-08-09 DIAGNOSIS — J4489 Other specified chronic obstructive pulmonary disease: Secondary | ICD-10-CM | POA: Diagnosis not present

## 2023-08-09 DIAGNOSIS — E119 Type 2 diabetes mellitus without complications: Secondary | ICD-10-CM | POA: Diagnosis not present

## 2023-08-09 DIAGNOSIS — Z6834 Body mass index (BMI) 34.0-34.9, adult: Secondary | ICD-10-CM | POA: Diagnosis not present

## 2023-08-09 DIAGNOSIS — Z7984 Long term (current) use of oral hypoglycemic drugs: Secondary | ICD-10-CM | POA: Diagnosis not present

## 2023-08-10 ENCOUNTER — Encounter: Payer: Self-pay | Admitting: Orthopedic Surgery

## 2023-08-13 DIAGNOSIS — G4733 Obstructive sleep apnea (adult) (pediatric): Secondary | ICD-10-CM | POA: Diagnosis not present

## 2023-08-22 DIAGNOSIS — M25561 Pain in right knee: Secondary | ICD-10-CM | POA: Diagnosis not present

## 2023-08-22 DIAGNOSIS — Z96651 Presence of right artificial knee joint: Secondary | ICD-10-CM | POA: Diagnosis not present

## 2023-08-24 DIAGNOSIS — M25561 Pain in right knee: Secondary | ICD-10-CM | POA: Diagnosis not present

## 2023-08-24 DIAGNOSIS — Z96651 Presence of right artificial knee joint: Secondary | ICD-10-CM | POA: Diagnosis not present

## 2023-08-29 DIAGNOSIS — Z96651 Presence of right artificial knee joint: Secondary | ICD-10-CM | POA: Diagnosis not present

## 2023-08-31 DIAGNOSIS — Z96651 Presence of right artificial knee joint: Secondary | ICD-10-CM | POA: Diagnosis not present

## 2023-08-31 DIAGNOSIS — M25561 Pain in right knee: Secondary | ICD-10-CM | POA: Diagnosis not present

## 2023-09-03 DIAGNOSIS — M25561 Pain in right knee: Secondary | ICD-10-CM | POA: Diagnosis not present

## 2023-09-03 DIAGNOSIS — Z96651 Presence of right artificial knee joint: Secondary | ICD-10-CM | POA: Diagnosis not present

## 2023-09-07 DIAGNOSIS — M25561 Pain in right knee: Secondary | ICD-10-CM | POA: Diagnosis not present

## 2023-09-07 DIAGNOSIS — Z96651 Presence of right artificial knee joint: Secondary | ICD-10-CM | POA: Diagnosis not present

## 2023-09-12 DIAGNOSIS — Z96651 Presence of right artificial knee joint: Secondary | ICD-10-CM | POA: Diagnosis not present

## 2023-09-12 DIAGNOSIS — E119 Type 2 diabetes mellitus without complications: Secondary | ICD-10-CM | POA: Diagnosis not present

## 2023-09-12 DIAGNOSIS — Z125 Encounter for screening for malignant neoplasm of prostate: Secondary | ICD-10-CM | POA: Diagnosis not present

## 2023-09-13 DIAGNOSIS — G4733 Obstructive sleep apnea (adult) (pediatric): Secondary | ICD-10-CM | POA: Diagnosis not present

## 2023-09-14 DIAGNOSIS — M25561 Pain in right knee: Secondary | ICD-10-CM | POA: Diagnosis not present

## 2023-09-14 DIAGNOSIS — Z96651 Presence of right artificial knee joint: Secondary | ICD-10-CM | POA: Diagnosis not present

## 2023-09-17 DIAGNOSIS — M25561 Pain in right knee: Secondary | ICD-10-CM | POA: Diagnosis not present

## 2023-09-17 DIAGNOSIS — Z96651 Presence of right artificial knee joint: Secondary | ICD-10-CM | POA: Diagnosis not present

## 2023-09-19 DIAGNOSIS — J4540 Moderate persistent asthma, uncomplicated: Secondary | ICD-10-CM | POA: Diagnosis not present

## 2023-09-19 DIAGNOSIS — Z96651 Presence of right artificial knee joint: Secondary | ICD-10-CM | POA: Diagnosis not present

## 2023-09-19 DIAGNOSIS — R5383 Other fatigue: Secondary | ICD-10-CM | POA: Diagnosis not present

## 2023-09-19 DIAGNOSIS — G4733 Obstructive sleep apnea (adult) (pediatric): Secondary | ICD-10-CM | POA: Diagnosis not present

## 2023-09-19 DIAGNOSIS — E119 Type 2 diabetes mellitus without complications: Secondary | ICD-10-CM | POA: Diagnosis not present

## 2023-09-19 DIAGNOSIS — Z125 Encounter for screening for malignant neoplasm of prostate: Secondary | ICD-10-CM | POA: Diagnosis not present

## 2023-09-19 DIAGNOSIS — I10 Essential (primary) hypertension: Secondary | ICD-10-CM | POA: Diagnosis not present

## 2023-09-19 DIAGNOSIS — F419 Anxiety disorder, unspecified: Secondary | ICD-10-CM | POA: Diagnosis not present

## 2023-09-24 DIAGNOSIS — M25561 Pain in right knee: Secondary | ICD-10-CM | POA: Diagnosis not present

## 2023-09-24 DIAGNOSIS — Z96651 Presence of right artificial knee joint: Secondary | ICD-10-CM | POA: Diagnosis not present

## 2023-09-26 DIAGNOSIS — Z96651 Presence of right artificial knee joint: Secondary | ICD-10-CM | POA: Diagnosis not present

## 2023-09-26 DIAGNOSIS — M25561 Pain in right knee: Secondary | ICD-10-CM | POA: Diagnosis not present

## 2023-10-13 DIAGNOSIS — G4733 Obstructive sleep apnea (adult) (pediatric): Secondary | ICD-10-CM | POA: Diagnosis not present

## 2023-11-13 DIAGNOSIS — G4733 Obstructive sleep apnea (adult) (pediatric): Secondary | ICD-10-CM | POA: Diagnosis not present

## 2023-12-12 ENCOUNTER — Other Ambulatory Visit: Payer: Self-pay | Admitting: Medical Genetics

## 2023-12-12 ENCOUNTER — Other Ambulatory Visit
Admission: RE | Admit: 2023-12-12 | Discharge: 2023-12-12 | Disposition: A | Discharge status: Home / Self Care | Payer: Self-pay | Source: Ambulatory Visit | Attending: Medical Genetics | Admitting: Medical Genetics

## 2023-12-12 DIAGNOSIS — Z96651 Presence of right artificial knee joint: Secondary | ICD-10-CM | POA: Diagnosis not present

## 2023-12-20 DIAGNOSIS — G4733 Obstructive sleep apnea (adult) (pediatric): Secondary | ICD-10-CM | POA: Diagnosis not present

## 2023-12-24 DIAGNOSIS — M25511 Pain in right shoulder: Secondary | ICD-10-CM | POA: Diagnosis not present

## 2023-12-24 DIAGNOSIS — G8929 Other chronic pain: Secondary | ICD-10-CM | POA: Diagnosis not present

## 2023-12-24 DIAGNOSIS — M75121 Complete rotator cuff tear or rupture of right shoulder, not specified as traumatic: Secondary | ICD-10-CM | POA: Diagnosis not present

## 2023-12-24 DIAGNOSIS — W010XXA Fall on same level from slipping, tripping and stumbling without subsequent striking against object, initial encounter: Secondary | ICD-10-CM | POA: Diagnosis not present

## 2023-12-24 DIAGNOSIS — M19011 Primary osteoarthritis, right shoulder: Secondary | ICD-10-CM | POA: Diagnosis not present

## 2023-12-24 LAB — GENECONNECT MOLECULAR SCREEN: Genetic Analysis Overall Interpretation: NEGATIVE

## 2023-12-25 ENCOUNTER — Other Ambulatory Visit: Payer: Self-pay | Admitting: Sports Medicine

## 2023-12-25 DIAGNOSIS — M75121 Complete rotator cuff tear or rupture of right shoulder, not specified as traumatic: Secondary | ICD-10-CM

## 2024-01-05 ENCOUNTER — Ambulatory Visit
Admission: RE | Admit: 2024-01-05 | Discharge: 2024-01-05 | Disposition: A | Discharge status: Home / Self Care | Payer: HMO | Source: Ambulatory Visit | Attending: Sports Medicine | Admitting: Sports Medicine

## 2024-01-05 DIAGNOSIS — M25511 Pain in right shoulder: Secondary | ICD-10-CM | POA: Diagnosis not present

## 2024-01-05 DIAGNOSIS — S46011A Strain of muscle(s) and tendon(s) of the rotator cuff of right shoulder, initial encounter: Secondary | ICD-10-CM | POA: Diagnosis not present

## 2024-01-05 DIAGNOSIS — M75121 Complete rotator cuff tear or rupture of right shoulder, not specified as traumatic: Secondary | ICD-10-CM

## 2024-01-10 DIAGNOSIS — M12811 Other specific arthropathies, not elsewhere classified, right shoulder: Secondary | ICD-10-CM | POA: Diagnosis not present

## 2024-01-10 DIAGNOSIS — E1169 Type 2 diabetes mellitus with other specified complication: Secondary | ICD-10-CM | POA: Diagnosis not present

## 2024-01-11 ENCOUNTER — Other Ambulatory Visit: Payer: Self-pay | Admitting: Orthopedic Surgery

## 2024-01-11 DIAGNOSIS — M12811 Other specific arthropathies, not elsewhere classified, right shoulder: Secondary | ICD-10-CM

## 2024-01-14 ENCOUNTER — Other Ambulatory Visit: Payer: Self-pay | Admitting: Orthopedic Surgery

## 2024-01-18 ENCOUNTER — Ambulatory Visit
Admission: RE | Admit: 2024-01-18 | Discharge: 2024-01-18 | Disposition: A | Discharge status: Home / Self Care | Payer: HMO | Source: Ambulatory Visit | Attending: Orthopedic Surgery | Admitting: Orthopedic Surgery

## 2024-01-18 DIAGNOSIS — Z01818 Encounter for other preprocedural examination: Secondary | ICD-10-CM | POA: Diagnosis not present

## 2024-01-18 DIAGNOSIS — S46811A Strain of other muscles, fascia and tendons at shoulder and upper arm level, right arm, initial encounter: Secondary | ICD-10-CM | POA: Diagnosis not present

## 2024-01-18 DIAGNOSIS — M12811 Other specific arthropathies, not elsewhere classified, right shoulder: Secondary | ICD-10-CM | POA: Diagnosis not present

## 2024-01-18 DIAGNOSIS — M19011 Primary osteoarthritis, right shoulder: Secondary | ICD-10-CM | POA: Diagnosis not present

## 2024-01-18 DIAGNOSIS — M75121 Complete rotator cuff tear or rupture of right shoulder, not specified as traumatic: Secondary | ICD-10-CM | POA: Diagnosis not present

## 2024-02-06 ENCOUNTER — Other Ambulatory Visit: Payer: Self-pay

## 2024-02-06 ENCOUNTER — Encounter
Admission: RE | Admit: 2024-02-06 | Discharge: 2024-02-06 | Disposition: A | Discharge status: Home / Self Care | Payer: PPO | Source: Ambulatory Visit | Attending: Orthopedic Surgery | Admitting: Orthopedic Surgery

## 2024-02-06 DIAGNOSIS — M12811 Other specific arthropathies, not elsewhere classified, right shoulder: Secondary | ICD-10-CM | POA: Diagnosis not present

## 2024-02-06 DIAGNOSIS — Z01818 Encounter for other preprocedural examination: Secondary | ICD-10-CM | POA: Insufficient documentation

## 2024-02-06 DIAGNOSIS — Z01812 Encounter for preprocedural laboratory examination: Secondary | ICD-10-CM

## 2024-02-06 DIAGNOSIS — Z01810 Encounter for preprocedural cardiovascular examination: Secondary | ICD-10-CM | POA: Diagnosis present

## 2024-02-06 HISTORY — DX: Obstructive sleep apnea (adult) (pediatric): G47.33

## 2024-02-06 HISTORY — DX: Chronic obstructive pulmonary disease, unspecified: J44.9

## 2024-02-06 HISTORY — DX: Bilateral primary osteoarthritis of knee: M17.0

## 2024-02-06 HISTORY — DX: Fatty (change of) liver, not elsewhere classified: K76.0

## 2024-02-06 HISTORY — DX: Diverticulitis of intestine, part unspecified, without perforation or abscess without bleeding: K57.92

## 2024-02-06 HISTORY — DX: Type 2 diabetes mellitus without complications: E11.9

## 2024-02-06 LAB — COMPREHENSIVE METABOLIC PANEL
ALT: 25 U/L (ref 0–44)
AST: 26 U/L (ref 15–41)
Albumin: 4.3 g/dL (ref 3.5–5.0)
Alkaline Phosphatase: 64 U/L (ref 38–126)
Anion gap: 10 (ref 5–15)
BUN: 18 mg/dL (ref 8–23)
CO2: 22 mmol/L (ref 22–32)
Calcium: 9.4 mg/dL (ref 8.9–10.3)
Chloride: 101 mmol/L (ref 98–111)
Creatinine, Ser: 0.85 mg/dL (ref 0.61–1.24)
GFR, Estimated: >60 mL/min (ref >60)
Glucose, Bld: 142 mg/dL — ABNORMAL HIGH (ref 70–99)
Potassium: 3.5 mmol/L (ref 3.5–5.1)
Sodium: 133 mmol/L — ABNORMAL LOW (ref 135–145)
Total Bilirubin: 1 mg/dL (ref 0.0–1.2)
Total Protein: 7.6 g/dL (ref 6.5–8.1)

## 2024-02-06 LAB — URINALYSIS, ROUTINE W REFLEX MICROSCOPIC
Bilirubin Urine: NEGATIVE
Glucose, UA: NEGATIVE mg/dL
Hgb urine dipstick: NEGATIVE
Ketones, ur: NEGATIVE mg/dL
Nitrite: NEGATIVE
Protein, ur: 30 mg/dL — AB
Specific Gravity, Urine: 1.01 (ref 1.005–1.030)
pH: 5 (ref 5.0–8.0)

## 2024-02-06 LAB — CBC WITH DIFFERENTIAL/PLATELET
Abs Immature Granulocytes: 0.02 10*3/uL (ref 0.00–0.07)
Basophils Absolute: 0 10*3/uL (ref 0.0–0.1)
Basophils Relative: 1 %
Eosinophils Absolute: 0.2 10*3/uL (ref 0.0–0.5)
Eosinophils Relative: 4 %
HCT: 41.4 % (ref 39.0–52.0)
Hemoglobin: 14.2 g/dL (ref 13.0–17.0)
Immature Granulocytes: 1 %
Lymphocytes Relative: 24 %
Lymphs Abs: 1 10*3/uL (ref 0.7–4.0)
MCH: 28.9 pg (ref 26.0–34.0)
MCHC: 34.3 g/dL (ref 30.0–36.0)
MCV: 84.1 fL (ref 80.0–100.0)
Monocytes Absolute: 0.6 10*3/uL (ref 0.1–1.0)
Monocytes Relative: 13 %
Neutro Abs: 2.4 10*3/uL (ref 1.7–7.7)
Neutrophils Relative %: 57 %
Platelets: 213 10*3/uL (ref 150–400)
RBC: 4.92 MIL/uL (ref 4.22–5.81)
RDW: 13.1 % (ref 11.5–15.5)
WBC: 4.2 10*3/uL (ref 4.0–10.5)
nRBC: 0 % (ref 0.0–0.2)

## 2024-02-06 LAB — SURGICAL PCR SCREEN
MRSA, PCR: NEGATIVE
Staphylococcus aureus: POSITIVE — AB

## 2024-02-06 NOTE — Patient Instructions (Addendum)
Your procedure is scheduled on: Friday, February 21 Report to the Registration Desk on the 1st floor of the [facility] To find out your arrival time, please call [phone] between 1PM - 3PM on: Thursday, February 20 If your arrival time is 6:00 am, do not arrive before that time as the [facility] entrance doors do not open until 6:00 am.  REMEMBER: Instructions that are not followed completely may result in serious medical risk, up to and including death; or upon the discretion of your surgeon and anesthesiologist your surgery may need to be rescheduled.  Do not eat food after midnight the night before surgery.  No gum chewing or hard candies.  You may however, drink water up to 2 hours before you are scheduled to arrive for your surgery. Do not drink anything within 2 hours of your scheduled arrival time.  In addition, your doctor has ordered for you to drink the provided:  Gatorade G2 Drinking this carbohydrate drink up to two hours before surgery helps to reduce insulin resistance and improve patient outcomes. Please complete drinking 2 hours before scheduled arrival time.  One week prior to surgery: starting February 14 Stop Anti-inflammatories (NSAIDS) such as Advil, Aleve, Ibuprofen, Motrin, Naproxen, Naprosyn and Aspirin based products such as Excedrin, Goody's Powder, BC Powder. Stop ANY OVER THE COUNTER supplements until after surgery.  You may however, continue to take Tylenol if needed for pain up until the day of surgery.  Metformin - hold for 2 days before surgery. Last day to take is Tuesday, February 18. Resume AFTER surgery.  Ozempic - hold for 7 days before surgery. Do NOT take Ozempic on Wednesday, February 19. Resume AFTER surgery on your regular weekly day, Wednesday, February 26.  Continue taking all of your other prescription medications up until the day of surgery.  ON THE DAY OF SURGERY DO NOT TAKE ANY MEDICATIONS   Use ALBUTEROL inhaler on the day of  surgery and bring to the hospital.  No Alcohol for 24 hours before or after surgery.  No Smoking including e-cigarettes for 24 hours before surgery.  No chewable tobacco products for at least 6 hours before surgery.  No nicotine patches on the day of surgery.  Do not use any "recreational" drugs for at least a week (preferably 2 weeks) before your surgery.  Please be advised that the combination of cocaine and anesthesia may have negative outcomes, up to and including death. If you test positive for cocaine, your surgery will be cancelled.  On the morning of surgery brush your teeth with toothpaste and water, you may rinse your mouth with mouthwash if you wish. Do not swallow any toothpaste or mouthwash.  Use CHG Soap as directed on instruction sheet.  Do not wear jewelry, make-up, hairpins, clips or nail polish.  For welded (permanent) jewelry: bracelets, anklets, waist bands, etc.  Please have this removed prior to surgery.  If it is not removed, there is a chance that hospital personnel will need to cut it off on the day of surgery.  Do not wear lotions, powders, or perfumes.   Do not shave body hair from the neck down 48 hours before surgery.  Contact lenses, hearing aids and dentures may not be worn into surgery.  Do not bring valuables to the hospital. [facility] is not responsible for any missing/lost belongings or valuables.   Total Shoulder Arthroplasty:  use Benzoyl Peroxide 5% Gel as directed on instruction sheet.  Bring your C-PAP to the hospital  in case you may have to spend the night.   Notify your doctor if there is any change in your medical condition (cold, fever, infection).  Wear comfortable clothing (specific to your surgery type) to the hospital.  After surgery, you can help prevent lung complications by doing breathing exercises.  Take deep breaths and cough every 1-2 hours. Your doctor may order a device called an Incentive Spirometer to help you take  deep breaths.  If you are being admitted to the hospital overnight, leave your suitcase in the car. After surgery it may be brought to your room.  In case of increased patient census, it may be necessary for you, the patient, to continue your postoperative care in the Same Day Surgery department.  If you are being discharged the day of surgery, you will not be allowed to drive home. You will need a responsible individual to drive you home and stay with you for 24 hours after surgery.   If you are taking public transportation, you will need to have a responsible individual with you.  Please call the Pre-admissions Testing Dept. at [phone] if you have any questions about these instructions.  Surgery Visitation Policy:  Patients having surgery or a procedure may have two visitors.  Children under the age of 68 must have an adult with them who is not the patient.  Temporary Visitor Restrictions Due to increasing cases of flu, RSV and COVID-19: Children ages 68 and under will not be able to visit patients in [facility] hospitals under most circumstances.  Inpatient Visitation:    Visiting hours are 7 a.m. to 8 p.m. Up to four visitors are allowed at one time in a patient room. The visitors may rotate out with other people during the day.  One visitor age 68 or older may stay with the patient overnight and must be in the room by 8 p.m.      Pre-operative 5 CHG Bath Instructions   You can play a key role in reducing the risk of infection after surgery. Your skin needs to be as free of germs as possible. You can reduce the number of germs on your skin by washing with CHG (chlorhexidine gluconate) soap before surgery. CHG is an antiseptic soap that kills germs and continues to kill germs even after washing.   DO NOT use if you have an allergy to chlorhexidine/CHG or antibacterial soaps. If your skin becomes reddened or irritated, stop using the CHG and notify one of our RNs at  [phone].   Please shower with the CHG soap starting 4 days before surgery using the following schedule:     Please keep in mind the following:  DO NOT shave, including legs and underarms, starting the day of your first shower.   You may shave your face at any point before/day of surgery.  Place clean sheets on your bed the day you start using CHG soap. Use a clean washcloth (not used since being washed) for each shower. DO NOT sleep with pets once you start using the CHG.   CHG Shower Instructions:  If you choose to wash your hair and private area, wash first with your normal shampoo/soap.  After you use shampoo/soap, rinse your hair and body thoroughly to remove shampoo/soap residue.  Turn the water OFF and apply about 3 tablespoons (45 ml) of CHG soap to a CLEAN washcloth.  Apply CHG soap ONLY FROM YOUR NECK DOWN TO YOUR TOES (washing for 3-5 minutes)  DO NOT  use CHG soap on face, private areas, open wounds, or sores.  Pay special attention to the area where your surgery is being performed.  If you are having back surgery, having someone wash your back for you may be helpful. Wait 2 minutes after CHG soap is applied, then you may rinse off the CHG soap.  Pat dry with a clean towel  Put on clean clothes/pajamas   If you choose to wear lotion, please use ONLY the CHG-compatible lotions on the back of this paper.     Additional instructions for the day of surgery: DO NOT APPLY any lotions, deodorants, cologne, or perfumes.   Put on clean/comfortable clothes.  Brush your teeth.  Ask your nurse before applying any prescription medications to the skin.      CHG Compatible Lotions   Aveeno Moisturizing lotion  Cetaphil Moisturizing Cream  Cetaphil Moisturizing Lotion  Clairol Herbal Essence Moisturizing Lotion, Dry Skin  Clairol Herbal Essence Moisturizing Lotion, Extra Dry Skin  Clairol Herbal Essence Moisturizing Lotion, Normal Skin  Curel Age Defying Therapeutic  Moisturizing Lotion with Alpha Hydroxy  Curel Extreme Care Body Lotion  Curel Soothing Hands Moisturizing Hand Lotion  Curel Therapeutic Moisturizing Cream, Fragrance-Free  Curel Therapeutic Moisturizing Lotion, Fragrance-Free  Curel Therapeutic Moisturizing Lotion, Original Formula  Eucerin Daily Replenishing Lotion  Eucerin Dry Skin Therapy Plus Alpha Hydroxy Crme  Eucerin Dry Skin Therapy Plus Alpha Hydroxy Lotion  Eucerin Original Crme  Eucerin Original Lotion  Eucerin Plus Crme Eucerin Plus Lotion  Eucerin TriLipid Replenishing Lotion  Keri Anti-Bacterial Hand Lotion  Keri Deep Conditioning Original Lotion Dry Skin Formula Softly Scented  Keri Deep Conditioning Original Lotion, Fragrance Free Sensitive Skin Formula  Keri Lotion Fast Absorbing Fragrance Free Sensitive Skin Formula  Keri Lotion Fast Absorbing Softly Scented Dry Skin Formula  Keri Original Lotion  Keri Skin Renewal Lotion Keri Silky Smooth Lotion  Keri Silky Smooth Sensitive Skin Lotion  Nivea Body Creamy Conditioning Oil  Nivea Body Extra Enriched Lotion  Nivea Body Original Lotion  Nivea Body Sheer Moisturizing Lotion Nivea Crme  Nivea Skin Firming Lotion  NutraDerm 30 Skin Lotion  NutraDerm Skin Lotion  NutraDerm Therapeutic Skin Cream  NutraDerm Therapeutic Skin Lotion  ProShield Protective Hand Cream  Provon moisturizing lotion   Preparing for Total Shoulder Arthroplasty  Before surgery, you can play an important role by reducing the number of germs on your skin by using the following products:  Benzoyl Peroxide Gel  o Reduces the number of germs present on the skin  o Applied twice a day to shoulder area starting two days before surgery  Chlorhexidine Gluconate (CHG) Soap  o An antiseptic cleaner that kills germs and bonds with the skin to continue killing germs even after washing  o Used for showering the night before surgery and morning of surgery  BENZOYL PEROXIDE 5% GEL  Please do  not use if you have an allergy to benzoyl peroxide. If your skin becomes reddened/irritated stop using the benzoyl peroxide.  Starting two days before surgery, apply as follows:  1. Apply benzoyl peroxide in the morning and at night. Apply after taking a shower. If you are not taking a shower, clean entire shoulder front, back, and side along with the armpit with a clean wet washcloth.  2. Place a quarter-sized dollop on your shoulder and rub in thoroughly, making sure to cover the front, back, and side of your shoulder, along with the armpit.  2 days before ____ AM ____ PM 1  day before ____ AM ____ PM  3. Do this twice a day for two days. (Last application is the night before surgery, AFTER using the CHG soap).  4. Do NOT apply benzoyl peroxide gel on the day of surgery.       Preoperative Educational Videos for Total Hip, Knee and Shoulder Replacements  To better prepare for surgery, please view our videos that explain the physical activity and discharge planning required to have the best surgical recovery at [facility].  [email or web address]  Questions? Call [phone] or email [email or web address]

## 2024-02-11 NOTE — Progress Notes (Signed)
  Perioperative Services Pre-Admission/Anesthesia Testing    Date: 02/11/24  Name: Alexander Rogers MRN:   [id number]  Re: GLP-1 clearance and provider recommendations   Planned Surgical Procedure(s):    Case: [id number] Date/Time: 02/15/24 6213   Procedure: Right reverse shoulder arthroplasty, biceps tenodesis (Right: Shoulder)   Anesthesia type: Choice   Pre-op diagnosis: Rotator cuff arthropathy of right shoulder M12.811   Location: [facility] OR ROOM 02 / [facility] ORS FOR ANESTHESIA [facility]   Surgeons: Signa Kell, MD      Clinical Notes:  Patient is scheduled for the above procedure with the indicated provider/surgeon. In review of his medication reconciliation it was noted that patient is on a prescribed GLP-1 medication. Per guidelines issued by the American Society of Anesthesiologists (ASA), it is recommended that these medications be held for 7 days prior to the patient undergoing any type of elective surgical procedure. The patient is taking the following GLP-1 medication:  [x]  SEMAGLUTIDE   []  EXENATIDE  []  LIRAGLUTIDE   []  LIXISENATIDE  []  DULAGLUTIDE     []  TIRZEPATIDE (GLP-1/GIP)  Reached out to prescribing provider Letitia Libra, MD) to make them aware of the guidelines from anesthesia. Given that this patient takes the prescribed GLP-1 medication for his  diabetes diagnosis, rather than for weight loss, recommendations from the prescribing provider were solicited. Prescribing provider made aware of the following so that informed decision/POC can be developed for this patient that may be taking medications belonging to these drug classes:  Oral GLP-1 medications will be held 1 day prior to surgery.  Injectable GLP-1 medications will be held 7 days prior to surgery.  Metformin is routinely held 48 hours prior to surgery due to renal concerns, potential need for contrasted imaging perioperatively, and the potential for tissue hypoxia leading to drug induced lactic acidosis.  All  SGLT2i medications are held 72 hours prior to surgery as they can be associated with the increased potential for developing euglycemic diabetic ketoacidosis (EDKA).   Impression and Plan:  [name] is on a prescribed GLP-1 medication, which induces the known side effect of decreased gastric emptying. Efforts are bring made to mitigate the risk of perioperative hyperglycemic events, as elevated blood glucose levels have been found to contribute to intra/postoperative complications. Additionally, hyperglycemic extremes can potentially necessitate the postponing of a patient's elective case in order to better optimize perioperative glycemic control, again with the aforementioned guidelines in place. With this in mind, recommendations have been sought from the prescribing provider, who has cleared patient to proceed with holding the prescribed GLP-1 as per the guidelines from the ASA.   Provider recommending: no further recommendations received from the prescribing provider.  Copy of signed clearance and recommendations placed on patient's chart for inclusion in their medical record and for review by the surgical/anesthetic team on the day of his procedure.   Quentin Mulling, MSN, APRN, FNP-C, CEN [facility]  Perioperative Services Nurse Practitioner Phone: [phone] Fax: [phone] 02/11/24 8:32 AM  NOTE: This note has been prepared using Dragon dictation software. Despite my best ability to proofread, there is always the potential that unintentional transcriptional errors may still occur from this process.

## 2024-02-15 ENCOUNTER — Ambulatory Visit: Payer: PPO

## 2024-02-15 ENCOUNTER — Other Ambulatory Visit: Payer: Self-pay

## 2024-02-15 ENCOUNTER — Encounter: Payer: Self-pay | Admitting: Orthopedic Surgery

## 2024-02-15 ENCOUNTER — Ambulatory Visit: Payer: PPO | Admitting: Urgent Care

## 2024-02-15 ENCOUNTER — Encounter
Admission: RE | Disposition: A | Discharge status: Home / Self Care | Payer: Self-pay | Source: Home / Self Care | Attending: Orthopedic Surgery

## 2024-02-15 ENCOUNTER — Ambulatory Visit: Payer: Self-pay | Admitting: Urgent Care

## 2024-02-15 ENCOUNTER — Ambulatory Visit
Admission: RE | Admit: 2024-02-15 | Discharge: 2024-02-15 | Disposition: A | Discharge status: Home / Self Care | Payer: PPO | Attending: Orthopedic Surgery | Admitting: Orthopedic Surgery

## 2024-02-15 DIAGNOSIS — G4730 Sleep apnea, unspecified: Secondary | ICD-10-CM | POA: Insufficient documentation

## 2024-02-15 DIAGNOSIS — F419 Anxiety disorder, unspecified: Secondary | ICD-10-CM | POA: Insufficient documentation

## 2024-02-15 DIAGNOSIS — J449 Chronic obstructive pulmonary disease, unspecified: Secondary | ICD-10-CM | POA: Diagnosis not present

## 2024-02-15 DIAGNOSIS — M12811 Other specific arthropathies, not elsewhere classified, right shoulder: Secondary | ICD-10-CM | POA: Diagnosis not present

## 2024-02-15 DIAGNOSIS — Z01812 Encounter for preprocedural laboratory examination: Secondary | ICD-10-CM

## 2024-02-15 DIAGNOSIS — Z471 Aftercare following joint replacement surgery: Secondary | ICD-10-CM | POA: Diagnosis not present

## 2024-02-15 DIAGNOSIS — W19XXXA Unspecified fall, initial encounter: Secondary | ICD-10-CM | POA: Diagnosis not present

## 2024-02-15 DIAGNOSIS — Z79899 Other long term (current) drug therapy: Secondary | ICD-10-CM | POA: Diagnosis not present

## 2024-02-15 DIAGNOSIS — S46011A Strain of muscle(s) and tendon(s) of the rotator cuff of right shoulder, initial encounter: Secondary | ICD-10-CM | POA: Insufficient documentation

## 2024-02-15 DIAGNOSIS — G8929 Other chronic pain: Secondary | ICD-10-CM | POA: Insufficient documentation

## 2024-02-15 DIAGNOSIS — M19011 Primary osteoarthritis, right shoulder: Secondary | ICD-10-CM | POA: Insufficient documentation

## 2024-02-15 DIAGNOSIS — E119 Type 2 diabetes mellitus without complications: Secondary | ICD-10-CM | POA: Diagnosis not present

## 2024-02-15 DIAGNOSIS — I10 Essential (primary) hypertension: Secondary | ICD-10-CM | POA: Diagnosis not present

## 2024-02-15 DIAGNOSIS — M7521 Bicipital tendinitis, right shoulder: Secondary | ICD-10-CM | POA: Diagnosis not present

## 2024-02-15 DIAGNOSIS — Z96611 Presence of right artificial shoulder joint: Secondary | ICD-10-CM | POA: Diagnosis not present

## 2024-02-15 DIAGNOSIS — G8918 Other acute postprocedural pain: Secondary | ICD-10-CM | POA: Diagnosis not present

## 2024-02-15 DIAGNOSIS — Z7984 Long term (current) use of oral hypoglycemic drugs: Secondary | ICD-10-CM | POA: Diagnosis not present

## 2024-02-15 HISTORY — PX: REVERSE SHOULDER ARTHROPLASTY: SHX5054

## 2024-02-15 LAB — GLUCOSE, CAPILLARY
Glucose-Capillary: 129 mg/dL — ABNORMAL HIGH (ref 70–99)
Glucose-Capillary: 191 mg/dL — ABNORMAL HIGH (ref 70–99)

## 2024-02-15 SURGERY — ARTHROPLASTY, SHOULDER, TOTAL, REVERSE
Anesthesia: Regional | Site: Shoulder | Laterality: Right

## 2024-02-15 MED ORDER — ONDANSETRON HCL 4 MG/2ML IJ SOLN
INTRAMUSCULAR | Status: DC | PRN
  Administered 2024-02-15: 4 mg via INTRAVENOUS

## 2024-02-15 MED ORDER — ORAL CARE MOUTH RINSE: 15.0000 mL | Freq: Once | OROMUCOSAL | Status: AC

## 2024-02-15 MED ORDER — LIDOCAINE HCL (PF) 1 % IJ SOLN
INTRAMUSCULAR | Status: DC | PRN
  Administered 2024-02-15: 3 mL

## 2024-02-15 MED ORDER — FENTANYL CITRATE PF 50 MCG/ML IJ SOSY
50.0000 ug | PREFILLED_SYRINGE | Freq: Once | INTRAMUSCULAR | Status: AC
  Administered 2024-02-15: 50 ug via INTRAVENOUS

## 2024-02-15 MED ORDER — PROPOFOL 10 MG/ML IV BOLUS
INTRAVENOUS | Status: AC
  Filled 2024-02-15: qty 20

## 2024-02-15 MED ORDER — MIDAZOLAM HCL 2 MG/2ML IJ SOLN
INTRAMUSCULAR | Status: DC | PRN
  Administered 2024-02-15: 2 mg via INTRAVENOUS

## 2024-02-15 MED ORDER — LIDOCAINE HCL (PF) 2 % IJ SOLN
INTRAMUSCULAR | Status: AC
  Filled 2024-02-15: qty 5

## 2024-02-15 MED ORDER — LIDOCAINE HCL (PF) 1 % IJ SOLN
INTRAMUSCULAR | Status: AC
  Filled 2024-02-15: qty 5

## 2024-02-15 MED ORDER — SUGAMMADEX SODIUM 200 MG/2ML IV SOLN
INTRAVENOUS | Status: DC | PRN
  Administered 2024-02-15: 200 mg via INTRAVENOUS

## 2024-02-15 MED ORDER — CEFAZOLIN SODIUM-DEXTROSE 2-4 GM/100ML-% IV SOLN
INTRAVENOUS | Status: AC
  Filled 2024-02-15: qty 100

## 2024-02-15 MED ORDER — GLYCOPYRROLATE 0.2 MG/ML IJ SOLN
INTRAMUSCULAR | Status: AC
  Filled 2024-02-15: qty 1

## 2024-02-15 MED ORDER — HYDROMORPHONE HCL 1 MG/ML IJ SOLN: 0.2500 mg | INTRAMUSCULAR | Status: DC | PRN

## 2024-02-15 MED ORDER — CHLORHEXIDINE GLUCONATE 0.12 % MT SOLN
OROMUCOSAL | Status: AC
  Filled 2024-02-15: qty 15

## 2024-02-15 MED ORDER — FENTANYL CITRATE (PF) 100 MCG/2ML IJ SOLN
INTRAMUSCULAR | Status: AC
  Filled 2024-02-15: qty 2

## 2024-02-15 MED ORDER — VANCOMYCIN HCL 1000 MG IV SOLR
INTRAVENOUS | Status: DC | PRN
  Administered 2024-02-15: 1000 mg via TOPICAL

## 2024-02-15 MED ORDER — ALBUTEROL SULFATE HFA 108 (90 BASE) MCG/ACT IN AERS
INHALATION_SPRAY | RESPIRATORY_TRACT | Status: AC
  Filled 2024-02-15: qty 6.7

## 2024-02-15 MED ORDER — OXYCODONE HCL 5 MG PO TABS: 5.0000 mg | ORAL_TABLET | Freq: Once | ORAL | Status: DC | PRN

## 2024-02-15 MED ORDER — MIDAZOLAM HCL 2 MG/2ML IJ SOLN
INTRAMUSCULAR | Status: AC
  Filled 2024-02-15: qty 2

## 2024-02-15 MED ORDER — SODIUM CHLORIDE 0.9 % IR SOLN
Status: DC | PRN
  Administered 2024-02-15: 3000 mL

## 2024-02-15 MED ORDER — CEFAZOLIN SODIUM-DEXTROSE 2-4 GM/100ML-% IV SOLN
2.0000 g | Freq: Once | INTRAVENOUS | Status: AC
Start: 2024-02-15 — End: 2024-02-15
  Administered 2024-02-15: 2 g via INTRAVENOUS

## 2024-02-15 MED ORDER — ALBUTEROL SULFATE HFA 108 (90 BASE) MCG/ACT IN AERS
INHALATION_SPRAY | RESPIRATORY_TRACT | Status: DC | PRN
  Administered 2024-02-15: 6 via RESPIRATORY_TRACT

## 2024-02-15 MED ORDER — SUGAMMADEX SODIUM 200 MG/2ML IV SOLN
INTRAVENOUS | Status: AC
  Filled 2024-02-15: qty 2

## 2024-02-15 MED ORDER — BUPIVACAINE LIPOSOME 1.3 % IJ SUSP
INTRAMUSCULAR | Status: AC
Start: 2024-02-15 — End: ?
  Filled 2024-02-15: qty 20

## 2024-02-15 MED ORDER — FENTANYL CITRATE (PF) 100 MCG/2ML IJ SOLN
INTRAMUSCULAR | Status: DC | PRN
  Administered 2024-02-15 (×2): 50 ug via INTRAVENOUS

## 2024-02-15 MED ORDER — DEXAMETHASONE SODIUM PHOSPHATE 10 MG/ML IJ SOLN
INTRAMUSCULAR | Status: AC
  Filled 2024-02-15: qty 1

## 2024-02-15 MED ORDER — 0.9 % SODIUM CHLORIDE (POUR BTL) OPTIME
TOPICAL | Status: DC | PRN
  Administered 2024-02-15: 500 mL

## 2024-02-15 MED ORDER — DEXMEDETOMIDINE HCL IN NACL 80 MCG/20ML IV SOLN
INTRAVENOUS | Status: DC | PRN
  Administered 2024-02-15: 8 ug via INTRAVENOUS

## 2024-02-15 MED ORDER — SODIUM CHLORIDE 0.9 % IV SOLN: INTRAVENOUS | Status: DC

## 2024-02-15 MED ORDER — PROPOFOL 10 MG/ML IV BOLUS
INTRAVENOUS | Status: DC | PRN
  Administered 2024-02-15: 50 mg via INTRAVENOUS
  Administered 2024-02-15: 200 mg via INTRAVENOUS

## 2024-02-15 MED ORDER — TRANEXAMIC ACID-NACL 1000-0.7 MG/100ML-% IV SOLN
1000.0000 mg | INTRAVENOUS | Status: AC
  Administered 2024-02-15: 1000 mg via INTRAVENOUS

## 2024-02-15 MED ORDER — BUPIVACAINE LIPOSOME 1.3 % IJ SUSP: INTRAMUSCULAR | Status: DC | PRN

## 2024-02-15 MED ORDER — EPHEDRINE SULFATE-NACL 50-0.9 MG/10ML-% IV SOSY
PREFILLED_SYRINGE | INTRAVENOUS | Status: DC | PRN
Start: 2024-02-15 — End: 2024-02-15
  Administered 2024-02-15 (×5): 5 mg via INTRAVENOUS

## 2024-02-15 MED ORDER — OXYCODONE HCL 5 MG PO TABS: 5.0000 mg | ORAL_TABLET | ORAL | 0 refills | Status: AC | PRN

## 2024-02-15 MED ORDER — ASPIRIN 325 MG PO TBEC
325.0000 mg | DELAYED_RELEASE_TABLET | Freq: Every day | ORAL | 0 refills | Status: AC
Start: 2024-02-16 — End: 2024-03-29

## 2024-02-15 MED ORDER — PHENYLEPHRINE 80 MCG/ML (10ML) SYRINGE FOR IV PUSH (FOR BLOOD PRESSURE SUPPORT)
PREFILLED_SYRINGE | INTRAVENOUS | Status: DC | PRN
  Administered 2024-02-15 (×7): 80 ug via INTRAVENOUS
  Administered 2024-02-15: 160 ug via INTRAVENOUS

## 2024-02-15 MED ORDER — CEFAZOLIN SODIUM-DEXTROSE 2-4 GM/100ML-% IV SOLN
2.0000 g | INTRAVENOUS | Status: AC
  Administered 2024-02-15: 2 g via INTRAVENOUS

## 2024-02-15 MED ORDER — OXYCODONE HCL 5 MG/5ML PO SOLN: 5.0000 mg | Freq: Once | ORAL | Status: DC | PRN

## 2024-02-15 MED ORDER — ONDANSETRON HCL 4 MG/2ML IJ SOLN
INTRAMUSCULAR | Status: AC
  Filled 2024-02-15: qty 2

## 2024-02-15 MED ORDER — DEXAMETHASONE SODIUM PHOSPHATE 10 MG/ML IJ SOLN
INTRAMUSCULAR | Status: DC | PRN
  Administered 2024-02-15: 10 mg via INTRAVENOUS

## 2024-02-15 MED ORDER — CHLORHEXIDINE GLUCONATE 0.12 % MT SOLN
15.0000 mL | Freq: Once | OROMUCOSAL | Status: AC
  Administered 2024-02-15: 15 mL via OROMUCOSAL

## 2024-02-15 MED ORDER — GLYCOPYRROLATE 0.2 MG/ML IJ SOLN
INTRAMUSCULAR | Status: DC | PRN
  Administered 2024-02-15: .2 mg via INTRAVENOUS

## 2024-02-15 MED ORDER — PHENYLEPHRINE 80 MCG/ML (10ML) SYRINGE FOR IV PUSH (FOR BLOOD PRESSURE SUPPORT)
PREFILLED_SYRINGE | INTRAVENOUS | Status: AC
  Filled 2024-02-15: qty 10

## 2024-02-15 MED ORDER — ROCURONIUM BROMIDE 10 MG/ML (PF) SYRINGE
PREFILLED_SYRINGE | INTRAVENOUS | Status: AC
Start: 2024-02-15 — End: ?
  Filled 2024-02-15: qty 10

## 2024-02-15 MED ORDER — TRANEXAMIC ACID-NACL 1000-0.7 MG/100ML-% IV SOLN
INTRAVENOUS | Status: AC
  Filled 2024-02-15: qty 100

## 2024-02-15 MED ORDER — VANCOMYCIN HCL 1000 MG IV SOLR
INTRAVENOUS | Status: AC
  Filled 2024-02-15: qty 20

## 2024-02-15 MED ORDER — ROCURONIUM BROMIDE 100 MG/10ML IV SOLN
INTRAVENOUS | Status: DC | PRN
  Administered 2024-02-15 (×2): 50 mg via INTRAVENOUS

## 2024-02-15 MED ORDER — ACETAMINOPHEN 500 MG PO TABS: 1000.0000 mg | ORAL_TABLET | Freq: Three times a day (TID) | ORAL | 2 refills | Status: AC

## 2024-02-15 MED ORDER — EPHEDRINE 5 MG/ML INJ
INTRAVENOUS | Status: AC
  Filled 2024-02-15: qty 5

## 2024-02-15 MED ORDER — ONDANSETRON 4 MG PO TBDP: 4.0000 mg | ORAL_TABLET | Freq: Three times a day (TID) | ORAL | 0 refills | Status: AC | PRN

## 2024-02-15 MED ORDER — BUPIVACAINE LIPOSOME 1.3 % IJ SUSP
INTRAMUSCULAR | Status: DC | PRN
  Administered 2024-02-15: 266 mg

## 2024-02-15 MED ORDER — FENTANYL CITRATE PF 50 MCG/ML IJ SOSY
PREFILLED_SYRINGE | INTRAMUSCULAR | Status: AC
  Filled 2024-02-15: qty 1

## 2024-02-15 SURGICAL SUPPLY — 75 items
BASEPLATE P2 COATD GLND 6.5X30 (Shoulder) IMPLANT
BIT DRILL JC 5IN 2.0M 127 23FL (BIT) IMPLANT
BLADE SAGITTAL WIDE XTHICK NO (BLADE) ×1 IMPLANT
CHLORAPREP W/TINT 26 (MISCELLANEOUS) ×1 IMPLANT
CNTNR URN SCR LID CUP LEK RST (MISCELLANEOUS) IMPLANT
COOLER POLAR GLACIER W/PUMP (MISCELLANEOUS) ×1 IMPLANT
DERMABOND ADVANCED .7 DNX12 (GAUZE/BANDAGES/DRESSINGS) IMPLANT
DRAPE INCISE IOBAN 66X45 STRL (DRAPES) ×2 IMPLANT
DRAPE SHEET LG 3/4 BI-LAMINATE (DRAPES) ×2 IMPLANT
DRAPE TABLE BACK 80X90 (DRAPES) ×1 IMPLANT
DRAPE U-SHAPE 47X51 STRL (DRAPES) ×1 IMPLANT
DRILL GLEN ALTIVATE 3.5 (DRILL) IMPLANT
DRSG OPSITE POSTOP 3X4 (GAUZE/BANDAGES/DRESSINGS) IMPLANT
DRSG OPSITE POSTOP 4X6 (GAUZE/BANDAGES/DRESSINGS) IMPLANT
DRSG OPSITE POSTOP 4X8 (GAUZE/BANDAGES/DRESSINGS) IMPLANT
DRSG TEGADERM 2-3/8X2-3/4 SM (GAUZE/BANDAGES/DRESSINGS) IMPLANT
ELECT REM PT RETURN 9FT ADLT (ELECTROSURGICAL) ×1
ELECTRODE REM PT RTRN 9FT ADLT (ELECTROSURGICAL) ×1 IMPLANT
EVACUATOR 1/8 PVC DRAIN (DRAIN) IMPLANT
GAUZE SPONGE 2X2 STRL 8-PLY (GAUZE/BANDAGES/DRESSINGS) IMPLANT
GAUZE XEROFORM 1X8 LF (GAUZE/BANDAGES/DRESSINGS) IMPLANT
GLOVE BIOGEL PI IND STRL 8 (GLOVE) ×2 IMPLANT
GLOVE PI ULTRA LF STRL 7.5 (GLOVE) ×2 IMPLANT
GLOVE SURG ORTHO 8.0 STRL STRW (GLOVE) ×2 IMPLANT
GLOVE SURG SYN 8.0 (GLOVE) ×1
GLOVE SURG SYN 8.0 PF PI (GLOVE) ×1 IMPLANT
GOWN STRL REUS W/ TWL LRG LVL3 (GOWN DISPOSABLE) ×2 IMPLANT
GOWN STRL REUS W/ TWL XL LVL3 (GOWN DISPOSABLE) ×1 IMPLANT
GUIDE BONE MODEL RSA DJO (ORTHOPEDIC DISPOSABLE SUPPLIES) IMPLANT
GUIDE WIRE ALTIVATE 2.4X228 SL (WIRE) IMPLANT
HEAD GLENOID W/SCREW 32MM (Shoulder) IMPLANT
HOOD PEEL AWAY T7 (MISCELLANEOUS) ×2 IMPLANT
INSERT EPOLY STND HUMERUS+4 36 (Shoulder) ×1 IMPLANT
INSERT EPOLYSTD HUMERUS+4 36 (Shoulder) IMPLANT
KIT STABILIZATION SHOULDER (MISCELLANEOUS) ×1 IMPLANT
MANIFOLD NEPTUNE II (INSTRUMENTS) ×1 IMPLANT
MASK FACE SPIDER DISP (MASK) ×1 IMPLANT
MAT ABSORB FLUID 56X50 GRAY (MISCELLANEOUS) ×1 IMPLANT
NDL REVERSE CUT 1/2 CRC (NEEDLE) IMPLANT
NDL SPNL 20GX3.5 QUINCKE YW (NEEDLE) IMPLANT
NEEDLE REVERSE CUT 1/2 CRC (NEEDLE)
NEEDLE SPNL 20GX3.5 QUINCKE YW (NEEDLE)
NS IRRIG 1000ML POUR BTL (IV SOLUTION) ×1 IMPLANT
P2 COATDE GLNOID BSEPLT 6.5X30 (Shoulder) ×1 IMPLANT
PACK ARTHROSCOPY SHOULDER (MISCELLANEOUS) ×1 IMPLANT
PAD WRAPON POLAR SHDR XLG (MISCELLANEOUS) ×1 IMPLANT
PULSAVAC PLUS IRRIG FAN TIP (DISPOSABLE) ×1
SCREW PERI ALTIVATE REV 18 (Screw) IMPLANT
SCREW PERI ALTIVATE REV 30 (Screw) IMPLANT
SCREW PERI ALTIVATE REV 38 (Screw) IMPLANT
SLING ULTRA II LG (MISCELLANEOUS) IMPLANT
SLING ULTRA II M (MISCELLANEOUS) IMPLANT
SPONGE T-LAP 18X18 ~~LOC~~+RFID (SPONGE) ×1 IMPLANT
STAPLER SKIN PROX 35W (STAPLE) IMPLANT
STEM HUMERAL 12X48 STD SHORT (Shoulder) IMPLANT
STRAP SAFETY 5IN WIDE (MISCELLANEOUS) ×1 IMPLANT
SUT ETHIBOND 5-0 MS/4 CCS GRN (SUTURE)
SUT FIBERWIRE #2 38 BLUE 1/2 (SUTURE) ×3
SUT MNCRL AB 4-0 PS2 18 (SUTURE) IMPLANT
SUT PROLENE 6 0 P 1 18 (SUTURE) IMPLANT
SUT TICRON 2-0 30IN 311381 (SUTURE) ×2 IMPLANT
SUT VIC AB 0 CT1 36 (SUTURE) ×1 IMPLANT
SUT VIC AB 2-0 CT2 27 (SUTURE) ×2 IMPLANT
SUT XBRAID 1.4 BLK/WHT (SUTURE) IMPLANT
SUT XBRAID 1.4 BLUE (SUTURE) IMPLANT
SUT XBRAID 1.4 WHITE/BLUE (SUTURE) IMPLANT
SUT XBRAID 2 BLACK/BLUE (SUTURE) IMPLANT
SUTURE ETHBND 5-0 MS/4 CCS GRN (SUTURE) ×1 IMPLANT
SUTURE FIBERWR #2 38 BLUE 1/2 (SUTURE) ×1 IMPLANT
SYR 30ML LL (SYRINGE) IMPLANT
TAP CANN GLEN 6.5 (TAP) IMPLANT
TIP FAN IRRIG PULSAVAC PLUS (DISPOSABLE) ×1 IMPLANT
TRAP FLUID SMOKE EVACUATOR (MISCELLANEOUS) ×1 IMPLANT
WATER STERILE IRR 500ML POUR (IV SOLUTION) ×1 IMPLANT
WRAPON POLAR PAD SHDR XLG (MISCELLANEOUS) ×1

## 2024-02-15 NOTE — Progress Notes (Signed)
Dr. Allena Katz came to see the patient and remove the closed System Drain Right Shoulder Accordian ( Hemovac) 10 Fr. The patient tolerated it well. This nurse is preparing the patient for discharge.

## 2024-02-15 NOTE — Anesthesia Procedure Notes (Addendum)
Anesthesia Regional Block: Interscalene brachial plexus block   Pre-Anesthetic Checklist: , timeout performed,  Correct Patient, Correct Site, Correct Laterality,  Correct Procedure, Correct Position, site marked,  Risks and benefits discussed,  Surgical consent,  Pre-op evaluation,  At surgeon's request and post-op pain management  Laterality: Right  Prep: alcohol swabs       Needles:  Injection technique: Single-shot  Needle Type: Echogenic Needle     Needle Length: 4cm  Needle Gauge: 25     Additional Needles:   Procedures:,,,, ultrasound used (permanent image in chart),,    Narrative:  Start time: 02/15/2024 7:27 AM End time: 02/15/2024 7:30 AM Injection made incrementally with aspirations every 5 mL.  Performed by: Personally  Anesthesiologist: Louie Boston, MD  Additional Notes: Patient's chart reviewed and they were deemed appropriate candidate for procedure, at surgeon's request. Patient educated about risks, benefits, and alternatives of the block including but not limited to: temporary or permanent nerve damage, bleeding, infection, damage to surround tissues, pneumothorax, hemidiaphragmatic paralysis, unilateral Horner's syndrome, block failure, local anesthetic toxicity. Patient expressed understanding. A formal time-out was conducted consistent with institution rules.  Monitors were applied, and minimal sedation used (see nursing record). The site was prepped with skin prep and allowed to dry, and sterile gloves were used. A high frequency linear ultrasound probe with probe cover was utilized throughout. C5-7 nerve roots located and appeared anatomically normal, local anesthetic injected around them, and echogenic block needle trajectory was monitored throughout. Aspiration performed every 5ml. Lung and blood vessels were avoided. All injections were performed without resistance and free of blood. Paresthesia present but resolved with adjustment of needle prior to  injection. The patient tolerated the procedure well.  Injectate: 20ml exparel

## 2024-02-15 NOTE — H&P (Signed)
 Paper H&P to be scanned into permanent record. H&P reviewed. No significant changes noted.

## 2024-02-15 NOTE — Anesthesia Postprocedure Evaluation (Signed)
Anesthesia Post Note  Patient: Alexander Rogers  Procedure(s) Performed: Right reverse shoulder arthroplasty, biceps tenodesis (Right: Shoulder)  Patient location during evaluation: PACU Anesthesia Type: Regional and General Level of consciousness: awake and alert Pain management: pain level controlled Vital Signs Assessment: post-procedure vital signs reviewed and stable Respiratory status: spontaneous breathing, nonlabored ventilation, respiratory function stable and patient connected to nasal cannula oxygen Cardiovascular status: blood pressure returned to baseline and stable Postop Assessment: no apparent nausea or vomiting Anesthetic complications: no   No notable events documented.   Last Vitals:  Vitals:   02/15/24 1115 02/15/24 1130  BP: 125/87 124/89  Pulse: 92 73  Resp: (!) 22 (!) 25  Temp: 36.8 C   SpO2: 92% 94%    Last Pain:  Vitals:   02/15/24 1130  TempSrc:   PainSc: 0-No pain                 Louie Boston

## 2024-02-15 NOTE — Progress Notes (Signed)
PT Cancellation Note  Patient Details Name: Alexander Rogers MRN: [id number] DOB: 13-Nov-1956   Cancelled Treatment:    Reason Eval/Treat Not Completed: Other (comment). PT spoke with pt, no questions or concerns at this time. Per pt and OT, pt ambulatory without AD, no LOB, and able to perform stairs. PT to sign off, no acute PT needs.    Olga Coaster PT, DPT 2:26 PM,02/15/24

## 2024-02-15 NOTE — Evaluation (Signed)
Occupational Therapy Evaluation Patient Details Name: Alexander Rogers MRN: [id number] DOB: 08/20/1956 Today's Date: 02/15/2024   History of Present Illness   Pt is a 68 yo male s/p R reverse TSA, biceps tenodesis.  PMH HTN, sleep apnea, anxiety, depression, DM, COPD,     Clinical Impressions Patient was seen for an OT evaluation this date. Pt lives with his spouse in a split level home with a level entry. Prior to surgery, pt was active and independent. Pt has orders for R (dominant) UE to be immobilized and will be NWBing per MD. Patient presents with impaired strength/ROM, and sensation to RUE with block not completely resolved yet. These impairments result in a decreased ability to perform self care tasks requiring mod assist for UB/LB dressing and bathing and  for application of polar care, and sling/immobilizer. Pt instructed in polar care mgt,, sling/immobilizer mgt, ROM exercises for RUE (with instructions for no shoulder exercises until full sensation has returned), RUE precautions, adaptive strategies for bathing/dressing/toileting/grooming, positioning and considerations for sleep, and home/routines modifications to maximize falls prevention, safety, and independence. OT adjusted sling/immobilizer and polar care to improve comfort, optimize positioning, and to maximize skin integrity/safety. Pt verbalized understanding of all education/training provided. No further skilled OT needs identified. Will sign off at this time. Please re-consult if additional need arise during this hospital stay.       If plan is discharge home, recommend the following:   A little help with bathing/dressing/bathroom;Assist for transportation     Functional Status Assessment   Patient has not had a recent decline in their functional status     Equipment Recommendations   None recommended by OT     Recommendations for Other Services         Precautions/Restrictions    Precautions Precautions: Fall;Shoulder Type of Shoulder Precautions: Rev. TSA, biceps tenodesis Shoulder Interventions: Shoulder abduction pillow;At all times;Off for dressing/bathing/exercises;Shoulder sling/immobilizer Restrictions Weight Bearing Restrictions Per Provider Order: Yes RUE Weight Bearing Per Provider Order: Non weight bearing     Mobility Bed Mobility               General bed mobility comments: NT pt in recliner at start/end of session.    Transfers Overall transfer level: Independent                 General transfer comment: Independent in room/hall.      Balance Overall balance assessment: No apparent balance deficits (not formally assessed)                                         ADL either performed or assessed with clinical judgement   ADL Overall ADL's : Needs assistance/impaired                                       General ADL Comments: MOD A to doff/don sling/polar care. Educated on compensatory ADL management strategies in setting of Rev. TSA precautions. Pt return verbalizes understanding and demos good recall/carryover form time caring for spouse after her shoulder sx. Spouse also present t/o session and return verbalizes understanding of education. Independent for functional mobility/LB ADL management from STS     Vision Baseline Vision/History: 1 Wears glasses Ability to See in Adequate Light: 1 Impaired Patient Visual Report: No change from baseline  Perception         Praxis         Pertinent Vitals/Pain Pain Assessment Pain Assessment: No/denies pain (scalene block still in place.)     Extremity/Trunk Assessment Upper Extremity Assessment Upper Extremity Assessment: Right hand dominant;RUE deficits/detail RUE Deficits / Details: R REV TSA           Communication Communication Communication: No apparent difficulties   Cognition Arousal: Alert Behavior During Therapy:  WFL for tasks assessed/performed Cognition: No apparent impairments                                       Cueing  General Comments          Exercises Other Exercises Other Exercises: Pt/caregiver educated on sling/polar care management, shoulder precautions, falls prevention strategies for home and hospital and compensatory ADL management strategies in setting of shoulder precautions.   Shoulder Instructions      Home Living Family/patient expects to be discharged to:: Private residence Living Arrangements: Spouse/significant other Available Help at Discharge: Family;Available 24 hours/day Type of Home: House Home Access: Level entry     Home Layout: Two level;Able to live on main level with bedroom/bathroom         Bathroom Toilet: Standard     Home Equipment: Agricultural consultant (2 wheels);Cane - single point          Prior Functioning/Environment Prior Level of Function : Independent/Modified Independent;Driving             Mobility Comments: indep, reports no falls ADLs Comments: indep, plays in local band    OT Problem List: Decreased coordination;Impaired UE functional use;Decreased knowledge of use of DME or AE;Decreased knowledge of precautions   OT Treatment/Interventions:        OT Goals(Current goals can be found in the care plan section)   Acute Rehab OT Goals Patient Stated Goal: To go home OT Goal Formulation: All assessment and education complete, DC therapy Time For Goal Achievement: 02/15/24 Potential to Achieve Goals: Good   OT Frequency:       Co-evaluation              AM-PAC OT "6 Clicks" Daily Activity     Outcome Measure Help from another person eating meals?: None Help from another person taking care of personal grooming?: None Help from another person toileting, which includes using toliet, bedpan, or urinal?: None Help from another person bathing (including washing, rinsing, drying)?: A Little Help  from another person to put on and taking off regular upper body clothing?: A Lot Help from another person to put on and taking off regular lower body clothing?: A Little 6 Click Score: 20   End of Session Nurse Communication: Mobility status  Activity Tolerance: Patient tolerated treatment well;No increased pain Patient left: in chair;with family/visitor present  OT Visit Diagnosis: Other abnormalities of gait and mobility (R26.89)                Time: 1610-9604 OT Time Calculation (min): 31 min Charges:  OT General Charges $OT Visit: 1 Visit OT Evaluation $OT Eval Low Complexity: 1 Low OT Treatments $Self Care/Home Management : 23-37 mins  Rockney Ghee, M.S., OTR/L 02/15/24, 3:07 PM

## 2024-02-15 NOTE — Anesthesia Procedure Notes (Signed)
Procedure Name: Intubation Date/Time: 02/15/2024 7:44 AM  Performed by: Rich Brave, CRNAPre-anesthesia Checklist: Patient identified, Emergency Drugs available, Suction available, Patient being monitored and Timeout performed Patient Re-evaluated:Patient Re-evaluated prior to induction Oxygen Delivery Method: Circle system utilized Preoxygenation: Pre-oxygenation with 100% oxygen Induction Type: IV induction Ventilation: Mask ventilation without difficulty and Oral airway inserted - appropriate to patient size Laryngoscope Size: McGrath and 4 Grade View: Grade II Tube type: Oral Tube size: 7.0 mm Number of attempts: 2 Airway Equipment and Method: Stylet and Video-laryngoscopy Placement Confirmation: ETT inserted through vocal cords under direct vision, positive ETCO2 and breath sounds checked- equal and bilateral Secured at: 19 cm Tube secured with: Tape Dental Injury: Teeth and Oropharynx as per pre-operative assessment

## 2024-02-15 NOTE — Anesthesia Preprocedure Evaluation (Signed)
Anesthesia Evaluation  Patient identified by MRN, date of birth, ID band Patient awake    Reviewed: Allergy & Precautions, NPO status , Patient's Chart, lab work & pertinent test results  History of Anesthesia Complications Negative for: history of anesthetic complications  Airway Mallampati: III  TM Distance: >3 FB Neck ROM: full    Dental no notable dental hx.    Pulmonary asthma , sleep apnea and Continuous Positive Airway Pressure Ventilation , COPD,  COPD inhaler   Pulmonary exam normal        Cardiovascular hypertension, On Medications negative cardio ROS Normal cardiovascular exam     Neuro/Psych  PSYCHIATRIC DISORDERS Anxiety     negative neurological ROS     GI/Hepatic negative GI ROS, Neg liver ROS,,,  Endo/Other  negative endocrine ROSdiabetes, Well Controlled, Type 2    Renal/GU      Musculoskeletal  (+) Arthritis ,    Abdominal   Peds  Hematology negative hematology ROS (+)   Anesthesia Other Findings Past Medical History: No date: Allergy     Comment:  Seasonal No date: Arthritis No date: Asthma No date: Bronchitis No date: COPD (chronic obstructive pulmonary disease) (HCC) No date: COVID No date: Decreased white blood cell count No date: Diverticulitis No date: Diverticulosis No date: Fatty liver No date: GAD (generalized anxiety disorder) No date: History of kidney stones No date: History of meniscal tear No date: History of pneumonia No date: Hyperlipidemia No date: Hypertension No date: Leukopenia No date: Obstructive sleep apnea on CPAP     Comment:  on CPAP No date: Osteoarthritis of knees, bilateral No date: Pneumonia No date: Restless leg syndrome No date: Thrombocytopenia (HCC) No date: Type 2 diabetes mellitus without complication [facility])  Past Surgical History: 06/29/2011: COLONOSCOPY 05/22/2017: COLONOSCOPY WITH PROPOFOL; N/A     Comment:  Procedure: COLONOSCOPY WITH  PROPOFOL;  Surgeon:               Christena Deem, MD;  Location: [facility] ENDOSCOPY;                Service: Endoscopy;  Laterality: N/A; 04/11/2023: COLONOSCOPY WITH PROPOFOL; N/A     Comment:  Procedure: COLONOSCOPY WITH PROPOFOL;  Surgeon: [place],               [name], MD;  Location: [facility] ENDOSCOPY;  Service:               Gastroenterology;  Laterality: N/A;  DM No date: ELBOW ARTHROSCOPY WITH TENDON RECONSTRUCTION; Right No date: EXCISIONAL HEMORRHOIDECTOMY No date: MENISCUS REPAIR; Left     Comment:  Knee surgery for meniscus tear left side No date: TONSILLECTOMY 02/12/2023: TOTAL KNEE ARTHROPLASTY; Left     Comment:  Procedure: TOTAL KNEE ARTHROPLASTY;  Surgeon: Reinaldo Berber, MD;  Location: [facility] ORS;  Service: Orthopedics;               Laterality: Left; 08/06/2023: TOTAL KNEE ARTHROPLASTY; Right     Comment:  Procedure: TOTAL KNEE ARTHROPLASTY;  Surgeon: Reinaldo Berber, MD;  Location: [facility] ORS;  Service: Orthopedics;               Laterality: Right; 2006: UMBILICAL HERNIA REPAIR 12/26/1987: VASECTOMY  BMI    Body Mass Index: 31.85 kg/m      Reproductive/Obstetrics negative OB ROS  Anesthesia Physical Anesthesia Plan  ASA: 2  Anesthesia Plan: General ETT   Post-op Pain Management: Ofirmev IV (intra-op)*, Toradol IV (intra-op)* and Regional block*   Induction: Intravenous  PONV Risk Score and Plan: 2 and Ondansetron, Dexamethasone, Midazolam and Treatment may vary due to age or medical condition  Airway Management Planned: Oral ETT  Additional Equipment:   Intra-op Plan:   Post-operative Plan: Extubation in OR  Informed Consent: I have reviewed the patients History and Physical, chart, labs and discussed the procedure including the risks, benefits and alternatives for the proposed anesthesia with the patient or authorized representative who has indicated his/her  understanding and acceptance.     Dental Advisory Given  Plan Discussed with: Anesthesiologist, CRNA and Surgeon  Anesthesia Plan Comments: (Patient consented for risks of anesthesia including but not limited to:  - adverse reactions to medications - damage to eyes, teeth, lips or other oral mucosa - nerve damage due to positioning  - sore throat or hoarseness - Damage to heart, brain, nerves, lungs, other parts of body or loss of life  Patient voiced understanding and assent.)       Anesthesia Quick Evaluation

## 2024-02-15 NOTE — Discharge Instructions (Addendum)
Alexander Albee, MD  [facility]  Phone: [phone]  Fax: [phone]   Discharge Instructions after Reverse Shoulder Replacement    1. Activity/Sling: You are to be non-weight bearing on operative extremity. A sling/shoulder immobilizer has been provided for you. Only remove the sling to perform elbow, wrist, and hand RoM exercises and hygiene/dressing. Active reaching and lifting are not permitted. You will be given further instructions on sling use at your first physical therapy visit and postoperative visit with Dr. Allena Katz.   2. Dressings: Dressing may be removed at 1st physical therapy visit (~3-4 days after surgery). Afterwards, you may either leave open to air (if no drainage) or cover with dry, sterile dressing. If you have steri-strips on your wound, please do not remove them. They will fall off on their own. You may shower 5 days after surgery. Please pat incision dry. Do not rub or place any shear forces across incision. If there is drainage or any opening of incision after 5 days, please notify our offices immediately.    3. Driving:  Plan on not driving for six weeks. Please note that you are advised NOT to drive while taking narcotic pain medications as you may be impaired and unsafe to drive.   4. Medications:  - You have been provided a prescription for narcotic pain medicine (usually oxycodone). After surgery, take 1-2 narcotic tablets every 4 hours if needed for severe pain. Please start this as soon as you begin to start having pain (if you received a nerve block, start taking as soon as this wears off).  - A prescription for anti-nausea medication will be provided in case the narcotic medicine causes nausea - take 1 tablet every 6 hours only if nauseated.  - Take enteric coated aspirin 325 mg once daily for 6 weeks to prevent blood clots. Do not take aspirin if you have an aspirin sensitivity/allergy or asthma or are on an anticoagulant (blood thinner) already. If so, then  your home anticoagulant will be resume and managed - do not take aspirin. -Take tylenol 1000mg  (2 Extra strength or 3 regular strength tablets) every 8 hours for pain. This will reduce the amount of narcotic medication needed. May stop tylenol when you are having minimal pain. - Take a stool softener (Colace, Dulcolax or Senakot) if you are using narcotic pain medications to help with constipation that is associated with narcotic use. - DO NOT take ANY nonsteroidal anti-inflammatory pain medications: Advil, Motrin, Ibuprofen, Aleve, Naproxen, or Naprosyn.   If you are taking prescription medication for anxiety, depression, insomnia, muscle spasm, chronic pain, or for attention deficit disorder you are advised that you are at a higher risk of adverse effects with use of narcotics post-op, including narcotic addiction/dependence, depressed breathing, death. If you use non-prescribed substances: alcohol, marijuana, cocaine, heroin, methamphetamines, etc., you are at a higher risk of adverse effects with use of narcotics post-op, including narcotic addiction/dependence, depressed breathing, death. You are advised that taking > 50 morphine milligram equivalents (MME) of narcotic pain medication per day results in twice the risk of overdose or death. For your prescription provided: oxycodone 5 mg - taking more than 6 tablets per day after the first few days of surgery.   5. Physical Therapy: 1-2 times per week for ~12 weeks. Therapy typically starts on post operative Day 3 or 4. You have been provided an order for physical therapy. The therapist will provide home exercises. Please contact our offices if this appointment has not been scheduled.  6. Work: May do light duty/desk job in approximately 2 weeks when off of narcotics, pain is well-controlled, and swelling has decreased if able to function with one arm in sling. Full work may take 6 weeks if light motions and function of both arms is required.  Lifting jobs may require 12 weeks.   7. Post-Op Appointments: Your first post-op appointment will be with Dr. Allena Katz in approximately 2 weeks time.    If you find that they have not been scheduled please call the Orthopaedic Appointment front desk at [phone].                               Alexander Albee, MD [facility] Phone: [phone] Fax: [phone]   REVERSE SHOULDER ARTHROPLASTY REHAB GUIDELINES   These guidelines should be tailored to individual patients based on their rehab goals, age, precautions, quality of repair, etc.  Progression should be based on patient progress and approval by the referring physician.  PHASE 1 - Day 1 through Week 2  GENERAL GUIDELINES AND PRECAUTIONS Sling wear 24/7 except during grooming and home exercises (3 to 5 times daily) Avoid shoulder extension such that the arm is posterior the frontal plane.  When patients recline, a pillow should be placed behind the upper arm and sling should be on.  They should be advised to always be able to see the elbow Avoid combined IR/ADD/EXT, such as hand behind back to prevent dislocation Avoid combined IR and ADD such as reaching across the chest to prevent dislocation No AROM No submersion in pool/water for 4 weeks No weight bearing through operative arm (as in transfers, walker use, etc.)  GOALS Maintain integrity of joint replacement; protect soft tissue healing Increase PROM for elevation to 120 and ER to 30 (will remain the goal for first 6 weeks) Optimize distal UE circulation and muscle activity (elbow, wrist and hand) Instruct in use of sling for proper fit, polar care device for ice application after HEP, signs/symptoms of infection  EXERCISES Active elbow, wrist and hand Passive forward elevation in scapular plane to 90-120 max motion; ER in scapular plane to 30 Active scapular retraction with arms resting in neutral position  CRITERIA TO PROGRESS TO  PHASE 2 Low pain (less than 3/10) with shoulder PROM Healing of incision without signs of infection Clearance by MD to advance after 2 week MD check up  PHASE 2 - 2 weeks - 6 weeks  GENERAL GUIDELINES AND PRECAUTIONS Sling may be removed while at home; worn in community without abduction pillow May use arm for light activities of daily living (such as feeding, brushing teeth, dressing.) with elbow near  the side of the body  and arm in front of the body- no active lifting of the arm May submerge in water (tub, pool, [place], etc.) after 4 weeks Continue to avoid WBing through the operative arm Continue to avoid combined IR/EXT/ADD (hand behind the back) and IR/ADD  (reaching across chest) for dislocation precautions  GOALS  Achieve passive elevation to 120 and ER to 30  Low (less than 3/10) to no pain  Ability to fire all heads of the deltoid  EXERCISES May discontinue grip, and active elbow and wrist exercises since using the arm in ADL's  with sling removed around the home Continue passive elevation to 120 and ER to 30, both in scapular plane with arm supported on table top Add submaximal isometrics, pain free effort, for all  functional heads of deltoid (anterior, posterior, middle)  Ensure that with posterior deltoid isometric the shoulder does not move into extension and the arm remains anterior the frontal plane At 4 weeks:  begin to place arm in balanced position of 90 deg elevation in supine; when patient able to hold this position with ease, may begin reverse pendulums clockwise and counterclockwise  CRITERIA TO PROGRESS TO PHASE 3 Passive forward elevation in scapular plane to 120; passive ER in scapular plane to 30 Ability to fire isometrically all heads of the deltoid muscle without pain Ability to place and hold the arm in balanced position (90 deg elevation in supine)  PHASE 3 - 6 weeks to 3 months  GENERAL GUIDELINES AND PRECAUTIONS Discontinue use of sling Avoid  forcing end range motion in any direction to prevent dislocation  May advance use of the arm actively in ADL's without being restricted to arm by the side of the body, however, avoid heavy lifting and sports (forever!) May initiate functional IR behind the back gently NO UPPER BODY ERGOMETER   GOALS Optimize PROM for elevation and ER in scapular plane with realistic expectation that max  mobility for elevation is usually around 145-160 passively; ER 40 to 50 passively; functional IR to L1 Recover AROM to approach as close to PROM available as possible; may expect 135-150 deg active elevation; 30 deg active ER; active functional IR to L1 Establish dynamic stability of the shoulder with deltoid and periscapular muscle gradual strengthening  EXERCISES Forward elevation in scapular plane active progression: supine to incline, to vertical; short to long lever arm Balanced position long lever arm AROM Active ER/IR with arm at side Scapular retraction with light band resistance Functional IR with hand slide up back - very gentle and gradual NO UPPER BODY ERGOMETER     CRITERIA TO PROGRESS TO PHASE 4  AROM equals/approaches PROM with good mechanics for elevation   No pain  Higher level demand on shoulder than ADL functions   PHASE 4 12 months and beyond  GENERAL GUIDELINES AND PRECAUTIONS No heavy lifting and no overhead sports No heavy pushing activity Gradually increase strength of deltoid and scapular stabilizers; also the rotator cuff if present with weights not to exceed 5 lbs NO UPPER BODY ERGOMETER   GOALS  Optimize functional use of the operative UE to meet the desired demands  Gradual increase in deltoid, scapular muscle, and rotator cuff strength  Pain free functional activities   EXERCISES Add light hand weights for deltoid up to and not to exceed 3 lbs for anterior and posterior with long arm lift against gravity; elbow bent to 90 deg for abduction in scapular  plane Theraband progression for extension to hip with scapular depression/retraction Theraband progression for serratus anterior punches in supine; avoid wall, incline or prone pressups for serratus anterior End range stretching gently without forceful overpressure in all planes (elevation in scapular plane, ER in scapular plane, functional IR) with stretching done for life as part of a daily routine NO UPPER BODY ERGOMETER     CRITERIA FOR DISCHARGE FROM SKILLED PHYSICAL THERAPY  Pain free AROM for shoulder elevation (expect around 135-150)  Functional strength for all ADL's, work tasks, and hobbies approved by Careers adviser  Independence with home maintenance program   NOTES: 1. With proper exercise, motion, strength, and function continue to improve even after one year. 2. The complication rate after surgery is 5 - 8%. Complications include infection, fracture, heterotopic bone formation, nerve injury, instability, rotator cuff  tear, and tuberosity nonunion. Please look for clinical signs, unusual symptoms, or lack of progress with therapy and report those to Dr. Allena Katz. Prefer more communication than less.  3. The therapy plan above only serves as a guide. Please be aware of specific individualized patient instructions as written on the prescription or through discussions with the surgeon. 4. Please call Dr. Allena Katz if you have any specific questions or concerns [phone]   POLAR CARE INFORMATION  MassAdvertisement.it  How to use [facility] Polar Care [facility] Therapy System?  YouTube   [email or web address]  OPERATING INSTRUCTIONS  Start the product With dry hands, connect the transformer to the electrical connection located on the top of the cooler. Next, plug the transformer into an appropriate electrical outlet. The unit will automatically start running at this point.  To stop the pump, disconnect electrical power.  Unplug to stop the product when not in use.  Unplugging the Polar Care unit turns it off. Always unplug immediately after use. Never leave it plugged in while unattended. Remove pad.    FIRST ADD WATER TO FILL LINE, THEN ICE---Replace ice when existing ice is almost melted  1 Discuss Treatment with your Licensed Health Care Practitioner and Use Only as Prescribed 2 Apply Insulation Barrier & Cold Therapy Pad 3 Check for Moisture 4 Inspect Skin Regularly  Tips and Trouble Shooting Usage Tips 1. Use cubed or chunked ice for optimal performance. 2. It is recommended to drain the Pad between uses. To drain the pad, hold the Pad upright with the hose pointed toward the ground. Depress the black plunger and allow water to drain out. 3. You may disconnect the Pad from the unit without removing the pad from the affected area by depressing the silver tabs on the hose coupling and gently pulling the hoses apart. The Pad and unit will seal itself and will not leak. Note: Some dripping during release is normal. 4. DO NOT RUN PUMP WITHOUT WATER! The pump in this unit is designed to run with water. Running the unit without water will cause permanent damage to the pump. 5. Unplug unit before removing lid.  TROUBLESHOOTING GUIDE Pump not running, Water not flowing to the pad, Pad is not getting cold 1. Make sure the transformer is plugged into the wall outlet. 2. Confirm that the ice and water are filled to the indicated levels. 3. Make sure there are no kinks in the pad. 4. Gently pull on the blue tube to make sure the tube/pad junction is straight. 5. Remove the pad from the treatment site and ll it while the pad is lying at; then reapply. 6. Confirm that the pad couplings are securely attached to the unit. Listen for the double clicks (Figure 1) to confirm the pad couplings are securely attached.  Leaks    Note: Some condensation on the lines, controller, and pads is unavoidable, especially in warmer climates. 1. If using a [facility] Polar Care Cold  Therapy unit with a detachable Cold Therapy Pad, and a leak exists (other than condensation on the lines) disconnect the pad couplings. Make sure the silver tabs on the couplings are depressed before reconnecting the pad to the pump hose; then confirm both sides of the coupling are properly clicked in. 2. If the coupling continues to leak or a leak is detected in the pad itself, stop using it and call [employer] Customer Care at [phone].  Cleaning After use, empty and dry the unit with a soft cloth. Warm water and mild  detergent may be used occasionally to clean the pump and tubes.  WARNING: The Polar Care Cube can be cold enough to cause serious injury, including full skin necrosis. Follow these Operating Instructions, and carefully read the Product Insert (see pouch on side of unit) and the Cold Therapy Pad Fitting Instructions (provided with each Cold Therapy Pad) prior to use.       SHOULDER SLING IMMOBILIZER   VIDEO Slingshot 2 Shoulder Brace Application - YouTube ---[email or web address]://www.porter.info/  INSTRUCTIONS While supporting the injured arm, slide the forearm into the sling. Wrap the adjustable shoulder strap around the neck and shoulders and attach the strap end to the sling using  the "alligator strap tab."  Adjust the shoulder strap to the required length. Position the shoulder pad behind the neck. To secure the shoulder pad location (optional), pull the shoulder strap away from the shoulder pad, unfold the hook material on the top of the pad, then press the shoulder strap back onto the hook material to secure the pad in place. Attach the closure strap across the open top of the sling. Position the strap so that it holds the arm securely in the sling. Next, attach the thumb strap to the open end of the sling between the thumb and fingers. After sling has been fit, it may be easily removed and reapplied using the quick release buckle on shoulder strap. If a neutral  pillow or 15 abduction pillow is included, place the pillow at the waistline. Attach the sling to the pillow, lining up hook material on the pillow with the loop on sling. Adjust the waist strap to fit.  If waist strap is too long, cut it to fit. Use the small piece of double sided hook material (located on top of the pillow) to secure the strap end. Place the double sided hook material on the inside of the cut strap end and secure it to the waist strap.     If no pillow is included, attach the waist strap to the sling and adjust to fit.    Washing Instructions: Straps and sling must be removed and cleaned regularly depending on your activity level and perspiration. Hand wash straps and sling in cold water with mild detergent, rinse, air dry   SHOULDER SLING IMMOBILIZER   VIDEO Slingshot 2 Shoulder Brace Application - YouTube ---[email or web address]://www.porter.info/  INSTRUCTIONS While supporting the injured arm, slide the forearm into the sling. Wrap the adjustable shoulder strap around the neck and shoulders and attach the strap end to the sling using  the "alligator strap tab."  Adjust the shoulder strap to the required length. Position the shoulder pad behind the neck. To secure the shoulder pad location (optional), pull the shoulder strap away from the shoulder pad, unfold the hook material on the top of the pad, then press the shoulder strap back onto the hook material to secure the pad in place. Attach the closure strap across the open top of the sling. Position the strap so that it holds the arm securely in the sling. Next, attach the thumb strap to the open end of the sling between the thumb and fingers. After sling has been fit, it may be easily removed and reapplied using the quick release buckle on shoulder strap. If a neutral pillow or 15 abduction pillow is included, place the pillow at the waistline. Attach the sling to the pillow, lining up hook material on the pillow with  the loop on sling. Adjust the waist strap to fit.  If  waist strap is too long, cut it to fit. Use the small piece of double sided hook material (located on top of the pillow) to secure the strap end. Place the double sided hook material on the inside of the cut strap end and secure it to the waist strap.     If no pillow is included, attach the waist strap to the sling and adjust to fit.    Washing Instructions: Straps and sling must be removed and cleaned regularly depending on your activity level and perspiration. Hand wash straps and sling in cold water with mild detergent, rinse, air dry     Interscalene Nerve Block (ISNB) Discharge Instructions    For your surgery you have received an Interscalene Nerve Block. Nerve Blocks affect many types of nerves, including nerves that control movement, pain and normal sensation.  You may experience feelings such as numbness, tingling, heaviness, weakness or the inability to move your arm or the feeling or sensation that your arm has "fallen asleep". A nerve block can last for 2 - 36 hours or more depending on the medication used.  Usually the weakness wears off first.  The tingling and heaviness usually wear off next.  Finally you may start to notice pain.  Keep in mind that this may occur in any order.  once a nerve block starts to wear off it is usually completely gone within 60 minutes. ISNB may cause mild shortness of breath, a hoarse voice, blurry vision, unequal pupils, or drooping of the face on the same side as the nerve block.  These symptoms will usually go away within 12 hours.  Very rarely the procedure itself can cause mild seizures. If needed, your surgeon will give you a prescription for pain medication.  It will take about 60 minutes for the oral pain medication to become fully effective.  So, it is recommended that you start taking this medication before the nerve block first begins to wear off, or when you first begin to feel  discomfort. Keep in mind that nerve blocks often wear off in the middle of the night.   If you are going to bed and the block has not started to wear off or you have not started to have any discomfort, consider setting an alarm for 2 to 3 hours, so you can assess your block.  If you notice the block is wearing off or you are starting to have discomfort, you can take your pain medication. Take your pain medication only as prescribed.  Pain medication can cause sedation and decrease your breathing if you take more than you need for the level of pain that you have. Nausea is a common side effect of many pain medications.  You may want to eat something before taking your pain medicine to prevent nausea. After an Interscalene nerve block, you cannot feel pain, pressure or extremes in temperature in the effected arm.  Because your arm is numb it is at an increased risk for injury.  To decrease the possibility of injury, please practice the following:  While you are awake change the position of your arm frequently to prevent too much pressure on any one area for prolonged periods of time.  If you have a cast or tight dressing, check the color or your fingers every couple of hours.  Call your surgeon with the appearance of any discoloration (white or blue). If you are given a sling to wear before you go home, please wear it  at all  times until the block has completely worn off.  Do not get up at night without your sling. If you experience any problems or concerns, please contact your surgeon's office. If you experience severe or prolonged shortness of breath go to the nearest emergency department.

## 2024-02-15 NOTE — Transfer of Care (Signed)
Immediate Anesthesia Transfer of Care Note  Patient: Alexander Rogers  Procedure(s) Performed: Right reverse shoulder arthroplasty, biceps tenodesis (Right: Shoulder)  Patient Location: PACU  Anesthesia Type:General and Regional  Level of Consciousness: awake, alert , oriented, and patient cooperative  Airway & Oxygen Therapy: Patient Spontanous Breathing and Patient connected to face mask oxygen  Post-op Assessment: Report given to RN and Post -op Vital signs reviewed and stable  Post vital signs: Reviewed and stable  Last Vitals:  Vitals Value Taken Time  BP 111/64 02/15/24 1045  Temp 36.6 C 02/15/24 1044  Pulse 77 02/15/24 1046  Resp 22 02/15/24 1046  SpO2 93 % 02/15/24 1046  Vitals shown include unfiled device data.  Last Pain:  Vitals:   02/15/24 1044  TempSrc:   PainSc: 0-No pain         Complications: No notable events documented.

## 2024-02-15 NOTE — Op Note (Addendum)
 SURGERY DATE: 02/15/2024   PRE-OP DIAGNOSIS:  1. Right shoulder rotator cuff arthropathy   POST-OP DIAGNOSIS:  1. Right shoulder rotator cuff arthropathy   PROCEDURES:  1. Right reverse total shoulder arthroplasty 2. Right biceps tenodesis   SURGEON: Rosealee Albee, MD   ANESTHESIA: Gen + interscalene block   ESTIMATED BLOOD LOSS: 200cc   TOTAL IV FLUIDS: per anesthesia record  IMPLANTS: [employer]: RSP Glenoid Head w/Retaining screw 36N; Monoblock Reverse Shoulder Baseplate with 6.80mm central screw; 3 locking screws into baseplate (superior, anterior, inferior); Standard Shell Short Humeral Stem 12 x 48mm; +4 Socket Insert;    INDICATION(S):  Alexander Rogers is a 68 y.o. male with chronic shoulder pain with inability to lift arm overhead. Imaging consistent with massive, irreparable rotator cuff tear. Conservative measures including medications and cortisone injections have not provided adequate relief. After discussion of risks, benefits, and alternatives to surgery, the patient elected to proceed with reverse shoulder arthroplasty and biceps tenodesis.   OPERATIVE FINDINGS: massive rotator cuff tear (complete supraspinatus, partial infraspinatus, partial subscapularis); severe biceps tendinopathy   OPERATIVE REPORT:   I identified Alexander Rogers in the pre-operative holding area. Informed consent was obtained and the surgical site was marked. I reviewed the risks and benefits of the proposed surgical intervention and the patient wished to proceed. An interscalene block with Exparel was administered by the Anesthesia team. The patient was transferred to the operative suite and general anesthesia was administered. The patient was placed in the beach chair position with the head of the bed elevated approximately 45 degrees. All down side pressure points were appropriately padded. Pre-op exam under anesthesia confirmed some stiffness and crepitus. Appropriate IV antibiotics were  administered. The extremity was then prepped and draped in standard fashion. A time out was performed confirming the correct extremity, correct patient, and correct procedure.   We used the standard deltopectoral incision from the coracoid to ~12cm distal. We found the cephalic vein and took it laterally. We opened the deltopectoral interval widely and placed retractors under the CA ligament in the subacromial space and under the deltoid tendon at its insertion. We then abducted and internally rotated the arm and released the underlying bursa between these retractors, taking care not to damage the circumflex branch of the axillary nerve.   Next, we brought the arm back in adduction at slight forward flexion with external rotation. We opened the clavipectoral fascia lateral to the conjoint tendon. We gently palpated the axillary nerve and verified its position and continuity on both sides of the humerus with a Tug test. This test was repeated multiple times during the procedure for nerve localization and confirmed to be intact at the end of the case. We then cauterized the anterior humeral circumflex ("Three sisters") vessels. The arm was then internally rotated, we cut the falciform ligament at approximately 1 cm of the upper portion of the pectoralis major insertion. Next we unroofed the bicipital groove. We proceeded with a soft tissue biceps tenodesis given the pathology (significant thickening and tendinopathy proximally) of the tendon.  After opening the biceps tendon sheath all the way to the supraglenoid tubercle, we performed a biceps tenodesis with two #2 TiCron sutures to the upper border of the pectoralis major. The proximal portion of the tendon was excised.   At this point, we could see that the supraspinatus was completely torn with a bald humeral head superiorly. The anterior infraspinatus was also torn. The subscapularis also had a partial tear of the  superior fibers. We performed a  subscapularis peel using electrocautery to remove the anterior capsule and subscapularis off of the humeral head. We released the inferior capsule from the humerus all the way to the posterior band of the inferior glenohumeral ligament. When this was complete we gently dislocated the shoulder up into the wound. We removed any osteophytes and made our cut with the appropriate inclination in 30 degrees of retroversion  We then turned our attention back to the glenoid. The proximal humerus was retracted posteriorly. The anterior capsule was dissected free from the subscapularis. The anterior capsule was then excised, exposing the anterior glenoid. We then grasped the labrum and removed it circumferentially. During the glenoid exposure, the axillary nerve was protected the entire time.    A patient-specific guide was used to drill the central guidepin. An appropriately sized reamer was used to ream the glenoid. A cannulated tap was placed over the guidepin. The monoblock baseplate was inserted and excellent fixation was achieved such that the entire scapula rotated with further attempted seating of the baseplate. The 3 peripheral screws were drilled, measured, and placed. The glenosphere was then placed and tightened.   We then turned our attention back to the humerus. We sized for a standard shell prosthesis. We sequentially used larger diameter canal finders until we met appropriate resistance and sequential broaching was performed to this size listed above. Trial poly inserts were placed. The humerus was trialed and noted to have satisfactory stability, motion, and deltoid tension with above listed poly. The trial implants were removed. 3 drill holes were placed about the lesser tuberosity footprint and FiberWire sutures were passed through these holes for subscapularis repair. Next, the implant was placed with the appropriate retroversion. Stability was confirmed. We placed the actual poly insert. The humerus  was reduced and motion, tension, and stability were satisfactory. A Hemovac drain was placed. Subscapularis was repaired with the previously passed #2 FiberWire sutures after passing them through the subscapularis.   We again verified the tension on the axillary nerve, appropriate range of motion, stability of the implant, and security of the subscapularis repair. We closed the deltopectoral interval deep to the cephalic vein with a running, 0-Vicryl suture. The skin was closed with 2-0 Vicryl and 4-0 Monocryl. Xeroform and Honeycomb dressing was applied. A PolarCare unit and sling were placed. Patient was extubated, transferred to a stretcher bed and to the post antesthesia care unit in stable condition.    POSTOPERATIVE PLAN: The patient will be discharged home from the PACU. Operative arm to remain in sling at all times except RoM exercises and hygiene. Can perform pendulums, elbow/wrist/hand RoM exercises. Passive RoM allowed to 90 FF and 30 ER. ASA 325mg  x 6 weeks for DVT ppx. Plan for PT starting on POD #3-4. Patient to return to clinic in ~2 weeks for post-operative appointment.

## 2024-02-18 ENCOUNTER — Encounter: Payer: Self-pay | Admitting: Orthopedic Surgery

## 2024-02-19 DIAGNOSIS — Z96611 Presence of right artificial shoulder joint: Secondary | ICD-10-CM | POA: Diagnosis not present

## 2024-02-19 DIAGNOSIS — M25511 Pain in right shoulder: Secondary | ICD-10-CM | POA: Diagnosis not present

## 2024-02-26 DIAGNOSIS — Z96611 Presence of right artificial shoulder joint: Secondary | ICD-10-CM | POA: Diagnosis not present

## 2024-02-27 DIAGNOSIS — M12811 Other specific arthropathies, not elsewhere classified, right shoulder: Secondary | ICD-10-CM | POA: Diagnosis not present

## 2024-03-03 DIAGNOSIS — M25511 Pain in right shoulder: Secondary | ICD-10-CM | POA: Diagnosis not present

## 2024-03-03 DIAGNOSIS — Z96611 Presence of right artificial shoulder joint: Secondary | ICD-10-CM | POA: Diagnosis not present

## 2024-03-11 DIAGNOSIS — Z96611 Presence of right artificial shoulder joint: Secondary | ICD-10-CM | POA: Diagnosis not present

## 2024-03-11 DIAGNOSIS — E119 Type 2 diabetes mellitus without complications: Secondary | ICD-10-CM | POA: Diagnosis not present

## 2024-03-11 DIAGNOSIS — Z125 Encounter for screening for malignant neoplasm of prostate: Secondary | ICD-10-CM | POA: Diagnosis not present

## 2024-03-18 DIAGNOSIS — F419 Anxiety disorder, unspecified: Secondary | ICD-10-CM | POA: Diagnosis not present

## 2024-03-18 DIAGNOSIS — E119 Type 2 diabetes mellitus without complications: Secondary | ICD-10-CM | POA: Diagnosis not present

## 2024-03-18 DIAGNOSIS — Z0001 Encounter for general adult medical examination with abnormal findings: Secondary | ICD-10-CM | POA: Diagnosis not present

## 2024-03-18 DIAGNOSIS — Z0000 Encounter for general adult medical examination without abnormal findings: Secondary | ICD-10-CM | POA: Diagnosis not present

## 2024-03-18 DIAGNOSIS — I10 Essential (primary) hypertension: Secondary | ICD-10-CM | POA: Diagnosis not present

## 2024-03-18 DIAGNOSIS — Z96611 Presence of right artificial shoulder joint: Secondary | ICD-10-CM | POA: Diagnosis not present

## 2024-03-18 DIAGNOSIS — J4540 Moderate persistent asthma, uncomplicated: Secondary | ICD-10-CM | POA: Diagnosis not present

## 2024-03-18 DIAGNOSIS — M25511 Pain in right shoulder: Secondary | ICD-10-CM | POA: Diagnosis not present

## 2024-03-19 DIAGNOSIS — G4733 Obstructive sleep apnea (adult) (pediatric): Secondary | ICD-10-CM | POA: Diagnosis not present

## 2024-03-26 DIAGNOSIS — Z96611 Presence of right artificial shoulder joint: Secondary | ICD-10-CM | POA: Diagnosis not present

## 2024-03-26 DIAGNOSIS — M25511 Pain in right shoulder: Secondary | ICD-10-CM | POA: Diagnosis not present

## 2024-03-31 DIAGNOSIS — M25511 Pain in right shoulder: Secondary | ICD-10-CM | POA: Diagnosis not present

## 2024-03-31 DIAGNOSIS — Z96611 Presence of right artificial shoulder joint: Secondary | ICD-10-CM | POA: Diagnosis not present

## 2024-04-02 DIAGNOSIS — M19011 Primary osteoarthritis, right shoulder: Secondary | ICD-10-CM | POA: Diagnosis not present

## 2024-04-02 DIAGNOSIS — Z96611 Presence of right artificial shoulder joint: Secondary | ICD-10-CM | POA: Diagnosis not present

## 2024-04-08 DIAGNOSIS — Z96653 Presence of artificial knee joint, bilateral: Secondary | ICD-10-CM | POA: Diagnosis not present

## 2024-04-08 DIAGNOSIS — Z96611 Presence of right artificial shoulder joint: Secondary | ICD-10-CM | POA: Diagnosis not present

## 2024-04-08 DIAGNOSIS — M25511 Pain in right shoulder: Secondary | ICD-10-CM | POA: Diagnosis not present

## 2024-04-10 DIAGNOSIS — M25511 Pain in right shoulder: Secondary | ICD-10-CM | POA: Diagnosis not present

## 2024-04-10 DIAGNOSIS — Z96611 Presence of right artificial shoulder joint: Secondary | ICD-10-CM | POA: Diagnosis not present

## 2024-04-15 DIAGNOSIS — Z96611 Presence of right artificial shoulder joint: Secondary | ICD-10-CM | POA: Diagnosis not present

## 2024-04-15 DIAGNOSIS — M25511 Pain in right shoulder: Secondary | ICD-10-CM | POA: Diagnosis not present

## 2024-04-17 DIAGNOSIS — M25511 Pain in right shoulder: Secondary | ICD-10-CM | POA: Diagnosis not present

## 2024-04-17 DIAGNOSIS — Z96611 Presence of right artificial shoulder joint: Secondary | ICD-10-CM | POA: Diagnosis not present

## 2024-04-23 DIAGNOSIS — Z96611 Presence of right artificial shoulder joint: Secondary | ICD-10-CM | POA: Diagnosis not present

## 2024-04-23 DIAGNOSIS — M25511 Pain in right shoulder: Secondary | ICD-10-CM | POA: Diagnosis not present

## 2024-04-25 DIAGNOSIS — M25511 Pain in right shoulder: Secondary | ICD-10-CM | POA: Diagnosis not present

## 2024-04-25 DIAGNOSIS — Z96611 Presence of right artificial shoulder joint: Secondary | ICD-10-CM | POA: Diagnosis not present

## 2024-04-30 DIAGNOSIS — Z96611 Presence of right artificial shoulder joint: Secondary | ICD-10-CM | POA: Diagnosis not present

## 2024-04-30 DIAGNOSIS — M25511 Pain in right shoulder: Secondary | ICD-10-CM | POA: Diagnosis not present

## 2024-05-02 DIAGNOSIS — Z96611 Presence of right artificial shoulder joint: Secondary | ICD-10-CM | POA: Diagnosis not present

## 2024-05-02 DIAGNOSIS — M25511 Pain in right shoulder: Secondary | ICD-10-CM | POA: Diagnosis not present

## 2024-05-06 DIAGNOSIS — M25511 Pain in right shoulder: Secondary | ICD-10-CM | POA: Diagnosis not present

## 2024-05-06 DIAGNOSIS — Z96611 Presence of right artificial shoulder joint: Secondary | ICD-10-CM | POA: Diagnosis not present

## 2024-05-08 DIAGNOSIS — Z96611 Presence of right artificial shoulder joint: Secondary | ICD-10-CM | POA: Diagnosis not present

## 2024-05-08 DIAGNOSIS — M25511 Pain in right shoulder: Secondary | ICD-10-CM | POA: Diagnosis not present

## 2024-05-14 DIAGNOSIS — M25511 Pain in right shoulder: Secondary | ICD-10-CM | POA: Diagnosis not present

## 2024-05-14 DIAGNOSIS — Z96611 Presence of right artificial shoulder joint: Secondary | ICD-10-CM | POA: Diagnosis not present

## 2024-05-16 DIAGNOSIS — Z96611 Presence of right artificial shoulder joint: Secondary | ICD-10-CM | POA: Diagnosis not present

## 2024-05-16 DIAGNOSIS — M25611 Stiffness of right shoulder, not elsewhere classified: Secondary | ICD-10-CM | POA: Diagnosis not present

## 2024-05-20 DIAGNOSIS — M25511 Pain in right shoulder: Secondary | ICD-10-CM | POA: Diagnosis not present

## 2024-05-20 DIAGNOSIS — Z96611 Presence of right artificial shoulder joint: Secondary | ICD-10-CM | POA: Diagnosis not present

## 2024-05-22 DIAGNOSIS — M25511 Pain in right shoulder: Secondary | ICD-10-CM | POA: Diagnosis not present

## 2024-05-22 DIAGNOSIS — Z96611 Presence of right artificial shoulder joint: Secondary | ICD-10-CM | POA: Diagnosis not present

## 2024-05-28 DIAGNOSIS — M25611 Stiffness of right shoulder, not elsewhere classified: Secondary | ICD-10-CM | POA: Diagnosis not present

## 2024-05-28 DIAGNOSIS — Z96611 Presence of right artificial shoulder joint: Secondary | ICD-10-CM | POA: Diagnosis not present

## 2024-05-30 DIAGNOSIS — Z96611 Presence of right artificial shoulder joint: Secondary | ICD-10-CM | POA: Diagnosis not present

## 2024-05-30 DIAGNOSIS — M25511 Pain in right shoulder: Secondary | ICD-10-CM | POA: Diagnosis not present

## 2024-06-06 DIAGNOSIS — Z96611 Presence of right artificial shoulder joint: Secondary | ICD-10-CM | POA: Diagnosis not present

## 2024-06-06 DIAGNOSIS — M25511 Pain in right shoulder: Secondary | ICD-10-CM | POA: Diagnosis not present

## 2024-06-13 DIAGNOSIS — M25611 Stiffness of right shoulder, not elsewhere classified: Secondary | ICD-10-CM | POA: Diagnosis not present

## 2024-06-13 DIAGNOSIS — Z96611 Presence of right artificial shoulder joint: Secondary | ICD-10-CM | POA: Diagnosis not present

## 2024-06-19 DIAGNOSIS — G4733 Obstructive sleep apnea (adult) (pediatric): Secondary | ICD-10-CM | POA: Diagnosis not present

## 2024-06-19 DIAGNOSIS — M25511 Pain in right shoulder: Secondary | ICD-10-CM | POA: Diagnosis not present

## 2024-06-19 DIAGNOSIS — J302 Other seasonal allergic rhinitis: Secondary | ICD-10-CM | POA: Diagnosis not present

## 2024-06-19 DIAGNOSIS — Z96611 Presence of right artificial shoulder joint: Secondary | ICD-10-CM | POA: Diagnosis not present

## 2024-06-19 DIAGNOSIS — J4520 Mild intermittent asthma, uncomplicated: Secondary | ICD-10-CM | POA: Diagnosis not present

## 2024-06-26 DIAGNOSIS — M25511 Pain in right shoulder: Secondary | ICD-10-CM | POA: Diagnosis not present

## 2024-06-26 DIAGNOSIS — Z96611 Presence of right artificial shoulder joint: Secondary | ICD-10-CM | POA: Diagnosis not present

## 2024-07-23 DIAGNOSIS — M25531 Pain in right wrist: Secondary | ICD-10-CM | POA: Diagnosis not present

## 2024-07-23 DIAGNOSIS — M79601 Pain in right arm: Secondary | ICD-10-CM | POA: Diagnosis not present

## 2024-07-23 DIAGNOSIS — L03113 Cellulitis of right upper limb: Secondary | ICD-10-CM | POA: Diagnosis not present

## 2024-08-18 DIAGNOSIS — H5213 Myopia, bilateral: Secondary | ICD-10-CM | POA: Diagnosis not present

## 2024-08-18 DIAGNOSIS — E119 Type 2 diabetes mellitus without complications: Secondary | ICD-10-CM | POA: Diagnosis not present

## 2024-08-18 DIAGNOSIS — H52213 Irregular astigmatism, bilateral: Secondary | ICD-10-CM | POA: Diagnosis not present

## 2024-08-18 DIAGNOSIS — H524 Presbyopia: Secondary | ICD-10-CM | POA: Diagnosis not present

## 2024-08-27 DIAGNOSIS — Z96611 Presence of right artificial shoulder joint: Secondary | ICD-10-CM | POA: Diagnosis not present

## 2024-09-11 DIAGNOSIS — E119 Type 2 diabetes mellitus without complications: Secondary | ICD-10-CM | POA: Diagnosis not present

## 2024-09-11 DIAGNOSIS — Z0001 Encounter for general adult medical examination with abnormal findings: Secondary | ICD-10-CM | POA: Diagnosis not present

## 2024-09-11 DIAGNOSIS — Z0000 Encounter for general adult medical examination without abnormal findings: Secondary | ICD-10-CM | POA: Diagnosis not present

## 2024-09-18 DIAGNOSIS — F419 Anxiety disorder, unspecified: Secondary | ICD-10-CM | POA: Diagnosis not present

## 2024-09-18 DIAGNOSIS — G2581 Restless legs syndrome: Secondary | ICD-10-CM | POA: Diagnosis not present

## 2024-09-18 DIAGNOSIS — I10 Essential (primary) hypertension: Secondary | ICD-10-CM | POA: Diagnosis not present

## 2024-09-18 DIAGNOSIS — G4733 Obstructive sleep apnea (adult) (pediatric): Secondary | ICD-10-CM | POA: Diagnosis not present

## 2024-09-18 DIAGNOSIS — E119 Type 2 diabetes mellitus without complications: Secondary | ICD-10-CM | POA: Diagnosis not present

## 2024-09-18 DIAGNOSIS — J4540 Moderate persistent asthma, uncomplicated: Secondary | ICD-10-CM | POA: Diagnosis not present

## 2024-09-18 DIAGNOSIS — Z125 Encounter for screening for malignant neoplasm of prostate: Secondary | ICD-10-CM | POA: Diagnosis not present

## 2024-09-19 DIAGNOSIS — G4733 Obstructive sleep apnea (adult) (pediatric): Secondary | ICD-10-CM | POA: Diagnosis not present

## 2024-09-29 ENCOUNTER — Encounter: Payer: Self-pay | Admitting: *Deleted

## 2024-09-29 NOTE — Progress Notes (Signed)
 Alexander Rogers                                          MRN: [id number]   09/29/2024   The [facility] Quality Team Specialist reviewed this patient medical record for the purposes of chart review for care gap closure. The following were reviewed: abstraction for care gap closure-controlling blood pressure, glycemic status assessment, and kidney health evaluation for diabetes:eGFR  and uACR.    [facility] Quality Team
